# Patient Record
Sex: Female | Born: 1970 | Race: Black or African American | Hispanic: No | Marital: Single | Smoking: Never smoker
Health system: Southern US, Community
[De-identification: ages and names within clinical notes are randomized; demographics above are authoritative.]

## PROBLEM LIST (undated history)

## (undated) DIAGNOSIS — E119 Type 2 diabetes mellitus without complications: Secondary | ICD-10-CM

## (undated) DIAGNOSIS — R7303 Prediabetes: Secondary | ICD-10-CM

## (undated) HISTORY — DX: Type 2 diabetes mellitus without complications: E11.9

## (undated) HISTORY — PX: OTHER SURGICAL HISTORY: SHX169

## (undated) HISTORY — PX: ABDOMINAL HYSTERECTOMY: SHX81

---

## 2000-08-15 ENCOUNTER — Inpatient Hospital Stay (HOSPITAL_COMMUNITY): Admission: AD | Admit: 2000-08-15 | Discharge: 2000-08-15 | Payer: Self-pay | Admitting: Obstetrics and Gynecology

## 2000-11-03 ENCOUNTER — Inpatient Hospital Stay (HOSPITAL_COMMUNITY): Admission: AD | Admit: 2000-11-03 | Discharge: 2000-11-10 | Payer: Self-pay | Admitting: Obstetrics and Gynecology

## 2000-11-15 ENCOUNTER — Encounter: Payer: Self-pay | Admitting: Obstetrics and Gynecology

## 2000-11-15 ENCOUNTER — Inpatient Hospital Stay (HOSPITAL_COMMUNITY): Admission: AD | Admit: 2000-11-15 | Discharge: 2000-11-20 | Payer: Self-pay | Admitting: Obstetrics and Gynecology

## 2000-12-13 ENCOUNTER — Other Ambulatory Visit: Admission: RE | Admit: 2000-12-13 | Discharge: 2000-12-13 | Payer: Self-pay | Admitting: *Deleted

## 2011-11-24 DIAGNOSIS — K922 Gastrointestinal hemorrhage, unspecified: Secondary | ICD-10-CM | POA: Insufficient documentation

## 2012-02-02 DIAGNOSIS — S32509A Unspecified fracture of unspecified pubis, initial encounter for closed fracture: Secondary | ICD-10-CM | POA: Insufficient documentation

## 2015-01-02 DIAGNOSIS — R768 Other specified abnormal immunological findings in serum: Secondary | ICD-10-CM | POA: Insufficient documentation

## 2015-12-06 ENCOUNTER — Emergency Department
Admission: EM | Admit: 2015-12-06 | Discharge: 2015-12-06 | Disposition: A | Discharge status: Home / Self Care | Payer: BLUE CROSS/BLUE SHIELD | Attending: Emergency Medicine | Admitting: Emergency Medicine

## 2015-12-06 ENCOUNTER — Encounter: Payer: Self-pay | Admitting: Emergency Medicine

## 2015-12-06 DIAGNOSIS — L509 Urticaria, unspecified: Secondary | ICD-10-CM | POA: Insufficient documentation

## 2015-12-06 DIAGNOSIS — L299 Pruritus, unspecified: Secondary | ICD-10-CM | POA: Diagnosis present

## 2015-12-06 DIAGNOSIS — L508 Other urticaria: Secondary | ICD-10-CM

## 2015-12-06 HISTORY — DX: Prediabetes: R73.03

## 2015-12-06 MED ORDER — PREDNISONE 10 MG (21) PO TBPK: ORAL_TABLET | ORAL | Status: DC

## 2015-12-06 MED ORDER — HYDROXYZINE HCL 25 MG PO TABS: 25.0000 mg | ORAL_TABLET | Freq: Three times a day (TID) | ORAL | Status: DC | PRN

## 2015-12-06 NOTE — ED Notes (Signed)
States received insect bites while at beach yesterday.

## 2015-12-06 NOTE — ED Notes (Addendum)
Patient states that yesterday she notice bit marks on her left arm and left leg. Patient states that the marks are itching, not painful.

## 2015-12-06 NOTE — Discharge Instructions (Signed)
Hives Hives are itchy, red, swollen areas of the skin. They can vary in size and location on your body. Hives can come and go for hours or several days (acute hives) or for several weeks (chronic hives). Hives do not spread from person to person (noncontagious). They may get worse with scratching, exercise, and emotional stress. CAUSES   Allergic reaction to food, additives, or drugs.  Infections, including the common cold.  Illness, such as vasculitis, lupus, or thyroid disease.  Exposure to sunlight, heat, or cold.  Exercise.  Stress.  Contact with chemicals. SYMPTOMS   Red or white swollen patches on the skin. The patches may change size, shape, and location quickly and repeatedly.  Itching.  Swelling of the hands, feet, and face. This may occur if hives develop deeper in the skin. DIAGNOSIS  Your caregiver can usually tell what is wrong by performing a physical exam. Skin or blood tests may also be done to determine the cause of your hives. In some cases, the cause cannot be determined. TREATMENT  Mild cases usually get better with medicines such as antihistamines. Severe cases may require an emergency epinephrine injection. If the cause of your hives is known, treatment includes avoiding that trigger.  HOME CARE INSTRUCTIONS   Avoid causes that trigger your hives.  Take antihistamines as directed by your caregiver to reduce the severity of your hives. Non-sedating or low-sedating antihistamines are usually recommended. Do not drive while taking an antihistamine.  Take any other medicines prescribed for itching as directed by your caregiver.  Wear loose-fitting clothing.  Keep all follow-up appointments as directed by your caregiver. SEEK MEDICAL CARE IF:   You have persistent or severe itching that is not relieved with medicine.  You have painful or swollen joints. SEEK IMMEDIATE MEDICAL CARE IF:   You have a fever.  Your tongue or lips are swollen.  You have  trouble breathing or swallowing.  You feel tightness in the throat or chest.  You have abdominal pain. These problems may be the first sign of a life-threatening allergic reaction. Call your local emergency services (911 in [place] MAKE SURE YOU:   Understand these instructions.  Will watch your condition.  Will get help right away if you are not doing well or get worse.   This information is not intended to replace advice given to you by your health care provider. Make sure you discuss any questions you have with your health care provider.   Document Released: 05/24/2005 Document Revised: 05/29/2013 Document Reviewed: 08/17/2011 Elsevier Interactive Patient Education 2016 Elsevier Inc.  

## 2015-12-06 NOTE — ED Provider Notes (Signed)
[facility] Emergency Department Provider Note  ____________________________________________  Time seen: Approximately 2:29 PM  I have reviewed the triage vital signs and the nursing notes.   HISTORY  Chief Complaint Insect Bite   HPI Kara Perez is a 45 y.o. female presents to the emergency department for evaluation of large, red raised areas on her left arm and left upper leg. She states that she went to the beach last week, and unlabored back yesterday she noticed that she was itching and noticed the hives. The areas have increased in size today and the itching is severe.  Past Medical History  Diagnosis Date  . Prediabetes     There are no active problems to display for this patient.   Past Surgical History  Procedure Laterality Date  . Abdominal hysterectomy    . Lacerated liver      Current Outpatient Rx  Name  Route  Sig  Dispense  Refill  . hydrOXYzine (ATARAX/VISTARIL) 25 MG tablet   Oral   Take 1 tablet (25 mg total) by mouth 3 (three) times daily as needed.   30 tablet   0   . predniSONE (STERAPRED UNI-PAK 21 TAB) 10 MG (21) TBPK tablet      Take 6 tablets on day 1 Take 5 tablets on day 2 Take 4 tablets on day 3 Take 3 tablets on day 4 Take 2 tablets on day 5 Take 1 tablet on day 6   21 tablet   0     Allergies Morphine and related  No family history on file.  Social History Social History  Substance Use Topics  . Smoking status: Never Smoker   . Smokeless tobacco: None  . Alcohol Use: No    Review of Systems  Constitutional: Negative for fever/chills Respiratory: Negative for shortness of breath. Musculoskeletal: Negative for pain. Skin: Positive for itching and redness Neurological: Negative for headaches, focal weakness or numbness. ____________________________________________   PHYSICAL EXAM:  VITAL SIGNS: ED Triage Vitals  Enc Vitals Group     BP 12/06/15 1401 122/72 mmHg     Pulse Rate  12/06/15 1401 66     Resp 12/06/15 1401 18     Temp 12/06/15 1401 98.3 F (36.8 C)     Temp Source 12/06/15 1401 Oral     SpO2 12/06/15 1401 95 %     Weight 12/06/15 1401 247 lb (112.038 kg)     Height 12/06/15 1401 5\' 11"  (1.803 m)     Head Cir --      Peak Flow --      Pain Score 12/06/15 1403 5     Pain Loc --      Pain Edu? --      Excl. in [place]? --      Constitutional: Alert and oriented. Well appearing and in no acute distress. Eyes: Conjunctivae are normal. EOMI. Nose: No congestion/rhinnorhea. Mouth/Throat: Mucous membranes are moist.   Neck: No stridor. Cardiovascular: Good peripheral circulation. Respiratory: Normal respiratory effort.  No retractions. Lungs Clear. Musculoskeletal: FROM throughout. Neurologic:  Normal speech and language. No gross focal neurologic deficits are appreciated. Skin: Erythematous, warm, raised areas on the left upper extremity and the left upper thigh.  ____________________________________________   LABS (all labs ordered are listed, but only abnormal results are displayed)  Labs Reviewed - No data to display ____________________________________________  EKG   ____________________________________________  RADIOLOGY   ____________________________________________   PROCEDURES  Procedure(s) performed: None ____________________________________________   INITIAL IMPRESSION /  ASSESSMENT AND PLAN / ED COURSE  Pertinent labs & imaging results that were available during my care of the patient were reviewed by me and considered in my medical decision making (see chart for details).  She will be advised to take the prednisone and hydroxyzine as prescribed.  She was advised to follow up with her primary care provider for symptoms that are not improving over the next 2-3 days.  She was also advised to return to the emergency department for symptoms that change or worsen if unable to schedule an  appointment.  ____________________________________________   FINAL CLINICAL IMPRESSION(S) / ED DIAGNOSES  Final diagnoses:  Urticaria, acute    New Prescriptions   HYDROXYZINE (ATARAX/VISTARIL) 25 MG TABLET    Take 1 tablet (25 mg total) by mouth 3 (three) times daily as needed.   PREDNISONE (STERAPRED UNI-PAK 21 TAB) 10 MG (21) TBPK TABLET    Take 6 tablets on day 1 Take 5 tablets on day 2 Take 4 tablets on day 3 Take 3 tablets on day 4 Take 2 tablets on day 5 Take 1 tablet on day 6     Estefania Kamiya B Leisl Spurrier, FNP 12/06/15 1435  Delman Kitten, MD 12/06/15 1539

## 2016-07-07 ENCOUNTER — Encounter: Payer: Self-pay | Admitting: *Deleted

## 2016-07-07 ENCOUNTER — Emergency Department
Admission: EM | Admit: 2016-07-07 | Discharge: 2016-07-07 | Disposition: A | Discharge status: Home / Self Care | Payer: BLUE CROSS/BLUE SHIELD | Attending: Emergency Medicine | Admitting: Emergency Medicine

## 2016-07-07 DIAGNOSIS — J111 Influenza due to unidentified influenza virus with other respiratory manifestations: Secondary | ICD-10-CM

## 2016-07-07 DIAGNOSIS — Z79899 Other long term (current) drug therapy: Secondary | ICD-10-CM | POA: Insufficient documentation

## 2016-07-07 DIAGNOSIS — R05 Cough: Secondary | ICD-10-CM | POA: Diagnosis present

## 2016-07-07 LAB — INFLUENZA PANEL BY PCR (TYPE A & B)
Influenza A By PCR: POSITIVE — AB
Influenza B By PCR: NEGATIVE

## 2016-07-07 MED ORDER — NAPROXEN 500 MG PO TABS: 500.0000 mg | ORAL_TABLET | Freq: Two times a day (BID) | ORAL | 0 refills | Status: AC

## 2016-07-07 MED ORDER — IBUPROFEN 600 MG PO TABS
600.0000 mg | ORAL_TABLET | Freq: Once | ORAL | Status: AC
  Administered 2016-07-07: 600 mg via ORAL
  Filled 2016-07-07: qty 1

## 2016-07-07 MED ORDER — OSELTAMIVIR PHOSPHATE 75 MG PO CAPS: 75.0000 mg | ORAL_CAPSULE | Freq: Two times a day (BID) | ORAL | 0 refills | Status: AC

## 2016-07-07 NOTE — ED Provider Notes (Signed)
[facility] Emergency Department Provider Note  ____________________________________________  Time seen: Approximately 6:31 PM  I have reviewed the triage vital signs and the nursing notes.   HISTORY  Chief Complaint Cough; Nasal Congestion; and Generalized Body Aches    HPI Kerstin A Vergel is a 46 y.o. female the emergency department with non productive cough, congestion, body aches for 2 days. Patient is eating and drinking normally. Son has similar symptoms. Patient has taken over-the-counter flu medication, which have not helped. Patient did not receive flu shot this year.   Past Medical History:  Diagnosis Date  . Prediabetes     There are no active problems to display for this patient.   Past Surgical History:  Procedure Laterality Date  . ABDOMINAL HYSTERECTOMY    . lacerated liver      Prior to Admission medications   Medication Sig Start Date End Date Taking? Authorizing Provider  hydrOXYzine (ATARAX/VISTARIL) 25 MG tablet Take 1 tablet (25 mg total) by mouth 3 (three) times daily as needed. 12/06/15   Victorino Dike, FNP  naproxen (NAPROSYN) 500 MG tablet Take 1 tablet (500 mg total) by mouth 2 (two) times daily with a meal. 07/07/16 07/07/17  Laban Emperor, PA-C  oseltamivir (TAMIFLU) 75 MG capsule Take 1 capsule (75 mg total) by mouth 2 (two) times daily. 07/07/16 07/12/16  Laban Emperor, PA-C  predniSONE (STERAPRED UNI-PAK 21 TAB) 10 MG (21) TBPK tablet Take 6 tablets on day 1 Take 5 tablets on day 2 Take 4 tablets on day 3 Take 3 tablets on day 4 Take 2 tablets on day 5 Take 1 tablet on day 6 12/06/15   Victorino Dike, FNP    Allergies Morphine and related  History reviewed. No pertinent family history.  Social History Social History  Substance Use Topics  . Smoking status: Never Smoker  . Smokeless tobacco: Not on file  . Alcohol use No     Review of Systems  Eyes: No visual changes. No discharge. ENT: Positive for  congestion and rhinorrhea. Cardiovascular: No chest pain. Respiratory: Positive for cough. No SOB. Gastrointestinal: No abdominal pain.  No nausea, no vomiting.  No diarrhea.  No constipation. Skin: Negative for rash, abrasions, lacerations, ecchymosis. Neurological: Negative for headaches.   ____________________________________________   PHYSICAL EXAM:  VITAL SIGNS: ED Triage Vitals [07/07/16 1018]  Enc Vitals Group     BP 110/75     Pulse Rate (!) 108     Resp 18     Temp 99.8 F (37.7 C)     Temp Source Oral     SpO2 95 %     Weight 238 lb (108 kg)     Height 5\' 11"  (1.803 m)     Head Circumference      Peak Flow      Pain Score 8     Pain Loc      Pain Edu?      Excl. in [place]?      Constitutional: Alert and oriented. Well appearing and in no acute distress. Eyes: Conjunctivae are normal. PERRL. EOMI. No discharge. Head: Atraumatic. ENT: No frontal and maxillary sinus tenderness.      Ears: Tympanic membranes pearly gray with good landmarks. No discharge.      Nose: Mild congestion/rhinnorhea.      Mouth/Throat: Mucous membranes are moist. Oropharynx non-erythematous. Tonsils not enlarged. No exudates. Uvula midline. Neck: No stridor.   Hematological/Lymphatic/Immunilogical: No cervical lymphadenopathy. Cardiovascular: Normal rate, regular rhythm. Normal  S1 and S2.  Good peripheral circulation. Respiratory: Normal respiratory effort without tachypnea or retractions. Lungs CTAB. Good air entry to the bases with no decreased or absent breath sounds. Gastrointestinal: Bowel sounds 4 quadrants. Soft and nontender to palpation. No guarding or rigidity. No palpable masses. No distention. Musculoskeletal: Full range of motion to all extremities. No gross deformities appreciated. Neurologic:  Normal speech and language. No gross focal neurologic deficits are appreciated.  Skin:  Skin is warm, dry and intact. No rash noted. Psychiatric: Mood and affect are normal. Speech and  behavior are normal. Patient exhibits appropriate insight and judgement.   ____________________________________________   LABS (all labs ordered are listed, but only abnormal results are displayed)  Labs Reviewed  INFLUENZA PANEL BY PCR (TYPE A & B) - Abnormal; Notable for the following:       Result Value   Influenza A By PCR POSITIVE (*)    All other components within normal limits   ____________________________________________  EKG   ____________________________________________  RADIOLOGY   No results found.  ____________________________________________    PROCEDURES  Procedure(s) performed:    Procedures    Medications  ibuprofen (ADVIL,MOTRIN) tablet 600 mg (600 mg Oral Given 07/07/16 1046)     ____________________________________________   INITIAL IMPRESSION / ASSESSMENT AND PLAN / ED COURSE  Pertinent labs & imaging results that were available during my care of the patient were reviewed by me and considered in my medical decision making (see chart for details).  Review of the [place] CSRS was performed in accordance of the [place] prior to dispensing any controlled drugs.     Patient's diagnosis is consistent with Influenza A. Vital signs and exam are reassuring. Influenza test positive for influenza A. Patient was given ibuprofen in ED for fever and muscle aches. Patient will be discharged home with prescriptions for naproxen and Tamiflu. Patient is to follow up with PCP as needed or otherwise directed. Patient is given ED precautions to return to the ED for any worsening or new symptoms.     ____________________________________________  FINAL CLINICAL IMPRESSION(S) / ED DIAGNOSES  Final diagnoses:  Influenza      NEW MEDICATIONS STARTED DURING THIS VISIT:  Discharge Medication List as of 07/07/2016 12:17 PM    START taking these medications   Details  naproxen (NAPROSYN) 500 MG tablet Take 1 tablet (500 mg total) by mouth 2 (two) times daily  with a meal., Starting Wed 07/07/2016, Until Thu 07/07/2017, Print    oseltamivir (TAMIFLU) 75 MG capsule Take 1 capsule (75 mg total) by mouth 2 (two) times daily., Starting Wed 07/07/2016, Until Mon 07/12/2016, Print            This chart was dictated using voice recognition software/Dragon. Despite best efforts to proofread, errors can occur which can change the meaning. Any change was purely unintentional.    Laban Emperor, PA-C 07/07/16 [place], MD 07/08/16 [phone]

## 2016-07-07 NOTE — ED Notes (Signed)
Nurse first note   States she developed body aches and fever fo r2 days

## 2016-07-07 NOTE — ED Triage Notes (Signed)
States body aches, cough, congestion and flu like symptoms for 2 days

## 2018-01-31 ENCOUNTER — Ambulatory Visit: Payer: BLUE CROSS/BLUE SHIELD | Admitting: Nurse Practitioner

## 2018-01-31 ENCOUNTER — Encounter: Payer: Self-pay | Admitting: Nurse Practitioner

## 2018-01-31 VITALS — BP 138/79 | HR 54 | Resp 16 | Ht 71.0 in | Wt 256.8 lb

## 2018-01-31 DIAGNOSIS — M25562 Pain in left knee: Secondary | ICD-10-CM

## 2018-01-31 DIAGNOSIS — M150 Primary generalized (osteo)arthritis: Secondary | ICD-10-CM | POA: Diagnosis not present

## 2018-01-31 DIAGNOSIS — Z0001 Encounter for general adult medical examination with abnormal findings: Secondary | ICD-10-CM

## 2018-01-31 DIAGNOSIS — Z1239 Encounter for other screening for malignant neoplasm of breast: Secondary | ICD-10-CM

## 2018-01-31 DIAGNOSIS — R7301 Impaired fasting glucose: Secondary | ICD-10-CM | POA: Diagnosis not present

## 2018-01-31 DIAGNOSIS — Z1231 Encounter for screening mammogram for malignant neoplasm of breast: Secondary | ICD-10-CM

## 2018-01-31 DIAGNOSIS — M25551 Pain in right hip: Secondary | ICD-10-CM | POA: Diagnosis not present

## 2018-01-31 DIAGNOSIS — G8929 Other chronic pain: Secondary | ICD-10-CM

## 2018-01-31 DIAGNOSIS — E559 Vitamin D deficiency, unspecified: Secondary | ICD-10-CM

## 2018-01-31 MED ORDER — MELOXICAM 7.5 MG PO TABS: 7.5000 mg | ORAL_TABLET | Freq: Two times a day (BID) | ORAL | 0 refills | Status: DC

## 2018-01-31 NOTE — Progress Notes (Signed)
[facility] [place], [place] [address] Internal MEDICINE  Office Visit Note  Patient Name: Kara Perez  [id number]  [id number]  Date of Service: 02/08/2018   Complaints/HPI Pt is here for establishment of PCP. Chief Complaint  Patient presents with  . New Patient (Initial Visit)    weight management  . Pain    joint and back pain. pt currently works 12hr shifts a day   Patient has chronic pain in right hip and left knee. She is new to the area and has not set up with orthopedic provider. Left knee does have swelling and mild deformity present. Left hip also has deformity noted.  Has been on belviq in the past. Did lose 32 pounds and had no negative side effects . She is doe to have routine blood work.    Current Medication: Outpatient Encounter Medications as of 01/31/2018  Medication Sig  . hydrOXYzine (ATARAX/VISTARIL) 25 MG tablet Take 1 tablet (25 mg total) by mouth 3 (three) times daily as needed. (Patient not taking: Reported on 01/31/2018)  . meloxicam (MOBIC) 7.5 MG tablet Take 1 tablet (7.5 mg total) by mouth 2 (two) times daily.  . predniSONE (STERAPRED UNI-PAK 21 TAB) 10 MG (21) TBPK tablet Take 6 tablets on day 1 Take 5 tablets on day 2 Take 4 tablets on day 3 Take 3 tablets on day 4 Take 2 tablets on day 5 Take 1 tablet on day 6 (Patient not taking: Reported on 01/31/2018)   No facility-administered encounter medications on file as of 01/31/2018.     Surgical History: Past Surgical History:  Procedure Laterality Date  . ABDOMINAL HYSTERECTOMY    . lacerated liver      Medical History: Past Medical History:  Diagnosis Date  . Prediabetes     Family History: Family History  Problem Relation Age of Onset  . Breast cancer Mother   . Hypertension Father   . Liver disease Father     Social History   Socioeconomic History  . Marital status: Legally Separated    Spouse name: Not on file  . Number of children: Not on file   . Years of education: Not on file  . Highest education level: Not on file  Occupational History  . Not on file  Social Needs  . Financial resource strain: Not on file  . Food insecurity:    Worry: Not on file    Inability: Not on file  . Transportation needs:    Medical: Not on file    Non-medical: Not on file  Tobacco Use  . Smoking status: Never Smoker  . Smokeless tobacco: Never Used  Substance and Sexual Activity  . Alcohol use: No  . Drug use: Not on file  . Sexual activity: Not on file  Lifestyle  . Physical activity:    Days per week: Not on file    Minutes per session: Not on file  . Stress: Not on file  Relationships  . Social connections:    Talks on phone: Not on file    Gets together: Not on file    Attends religious service: Not on file    Active member of club or organization: Not on file    Attends meetings of clubs or organizations: Not on file    Relationship status: Not on file  . Intimate partner violence:    Fear of current or ex partner: Not on file    Emotionally abused: Not on file  Physically abused: Not on file    Forced sexual activity: Not on file  Other Topics Concern  . Not on file  Social History Narrative  . Not on file     Review of Systems  Constitutional: Positive for fatigue and unexpected weight change. Negative for chills.       Recent weight gain.  HENT: Negative for congestion, postnasal drip, rhinorrhea, sneezing and sore throat.   Eyes: Negative.  Negative for redness.  Respiratory: Negative for cough, chest tightness and shortness of breath.   Cardiovascular: Negative for chest pain and palpitations.  Gastrointestinal: Negative for abdominal pain, constipation, diarrhea, nausea and vomiting.  Endocrine: Negative for cold intolerance, heat intolerance, polydipsia, polyphagia and polyuria.  Genitourinary: Negative.  Negative for dysuria and frequency.  Musculoskeletal: Positive for arthralgias, back pain and myalgias.  Negative for joint swelling and neck pain.  Skin: Negative for rash.  Allergic/Immunologic: Negative for environmental allergies.  Neurological: Negative for dizziness, tremors, weakness, numbness and headaches.  Hematological: Does not bruise/bleed easily.  Psychiatric/Behavioral: Negative for behavioral problems (Depression), sleep disturbance and suicidal ideas. The patient is not nervous/anxious.     Today's Vitals   01/31/18 1141  BP: 138/79  Pulse: (!) 54  Resp: 16  SpO2: 98%  Weight: 256 lb 12.8 oz (116.5 kg)  Height: 5\' 11"  (1.803 m)    Physical Exam  Constitutional: She is oriented to person, place, and time. She appears well-developed and well-nourished. No distress.  HENT:  Head: Normocephalic and atraumatic.  Nose: Nose normal.  Mouth/Throat: Oropharynx is clear and moist. No oropharyngeal exudate.  Eyes: Pupils are equal, round, and reactive to light. Conjunctivae and EOM are normal.  Neck: Normal range of motion. Neck supple. No JVD present. No tracheal deviation present. No thyromegaly present.  Cardiovascular: Normal rate, regular rhythm and normal heart sounds. Exam reveals no gallop and no friction rub.  No murmur heard. Pulmonary/Chest: Effort normal and breath sounds normal. No respiratory distress. She has no wheezes. She has no rales. She exhibits no tenderness.  Abdominal: Soft. Bowel sounds are normal. There is no tenderness.  Musculoskeletal: Normal range of motion.  Right hip pain, worse when putting weight on her leg. Some swelling is present. ROM and strength are intact . Left knee tenderness with some swelling present. Crepitus can be felt with flexion and extension of her knee. ROM and strength are intact.   Lymphadenopathy:    She has no cervical adenopathy.  Neurological: She is alert and oriented to person, place, and time. No cranial nerve deficit.  Skin: Skin is warm and dry. She is not diaphoretic.  Psychiatric: She has a normal mood and  affect. Her behavior is normal. Judgment and thought content normal.  Nursing note and vitals reviewed.  Assessment/Plan: 1. Chronic pain of left knee Add meloxicam 7.5mg  which may be taken twice daily as needed for pain and inflammation. Refer to orthopedics for further evaluation.   2. Chronic right hip pain Add meloxicam 7.5mg  which may be taken twice daily as needed for pain and inflammation. Refer to orthopedics for further evaluation.  - Ambulatory referral to Orthopedic Surgery  3. Primary generalized (osteo)arthritis Add meloxicam 7.5mg  which may be taken twice daily as needed for pain and inflammation. Refer to orthopedics for further evaluation.  - meloxicam (MOBIC) 7.5 MG tablet; Take 1 tablet (7.5 mg total) by mouth 2 (two) times daily.  Dispense: 30 tablet; Refill: 0  4. Impaired fasting glucose - CBC with Differential/Platelet - Comprehensive  metabolic panel - TSH - T4, free  5. Vitamin D deficiency - Vitamin D 1,25 dihydroxy  6. Screening for breast cancer - MM DIGITAL SCREENING BILATERAL; Future    General Counseling: Keziah verbalizes understanding of the findings of todays visit and agrees with plan of treatment. I have discussed any further diagnostic evaluation that may be needed or ordered today. We also reviewed her medications today. she has been encouraged to call the office with any questions or concerns that should arise related to todays visit.    Counseling:  This patient was seen by Leretha Pol FNP Collaboration with Dr Lavera Guise as a part of collaborative care agreement  Orders Placed This Encounter  Procedures  . MM DIGITAL SCREENING BILATERAL  . CBC with Differential/Platelet  . Comprehensive metabolic panel  . TSH  . T4, free  . Lipid panel  . Vitamin D 1,25 dihydroxy  . Ambulatory referral to Orthopedic Surgery    Meds ordered this encounter  Medications  . meloxicam (MOBIC) 7.5 MG tablet    Sig: Take 1 tablet (7.5 mg total)  by mouth 2 (two) times daily.    Dispense:  30 tablet    Refill:  0    Order Specific Question:   Supervising Provider    Answer:   Lavera Guise [[id number]]    Time spent: 25 Minutes

## 2018-02-08 DIAGNOSIS — G8929 Other chronic pain: Secondary | ICD-10-CM | POA: Insufficient documentation

## 2018-02-08 DIAGNOSIS — M150 Primary generalized (osteo)arthritis: Secondary | ICD-10-CM | POA: Insufficient documentation

## 2018-02-08 DIAGNOSIS — R7301 Impaired fasting glucose: Secondary | ICD-10-CM | POA: Insufficient documentation

## 2018-02-08 DIAGNOSIS — E559 Vitamin D deficiency, unspecified: Secondary | ICD-10-CM | POA: Insufficient documentation

## 2018-02-08 DIAGNOSIS — Z0001 Encounter for general adult medical examination with abnormal findings: Secondary | ICD-10-CM | POA: Insufficient documentation

## 2018-02-08 DIAGNOSIS — M25562 Pain in left knee: Principal | ICD-10-CM

## 2018-02-08 DIAGNOSIS — M25551 Pain in right hip: Secondary | ICD-10-CM

## 2018-02-14 ENCOUNTER — Other Ambulatory Visit: Payer: Self-pay | Admitting: Nurse Practitioner

## 2018-02-15 LAB — COMPREHENSIVE METABOLIC PANEL
A/G RATIO: 1.5 (ref 1.2–2.2)
ALT: 12 IU/L (ref 0–32)
AST: 14 IU/L (ref 0–40)
Albumin: 4.3 g/dL (ref 3.5–5.5)
Alkaline Phosphatase: 100 IU/L (ref 39–117)
BUN/Creatinine Ratio: 21 (ref 9–23)
BUN: 15 mg/dL (ref 6–24)
Bilirubin Total: 0.5 mg/dL (ref 0.0–1.2)
CO2: 22 mmol/L (ref 20–29)
CREATININE: 0.71 mg/dL (ref 0.57–1.00)
Calcium: 9 mg/dL (ref 8.7–10.2)
Chloride: 105 mmol/L (ref 96–106)
GFR calc Af Amer: 116 mL/min/{1.73_m2} (ref >59)
GFR, EST NON AFRICAN AMERICAN: 100 mL/min/{1.73_m2} (ref >59)
Globulin, Total: 2.8 g/dL (ref 1.5–4.5)
Glucose: 106 mg/dL — ABNORMAL HIGH (ref 65–99)
POTASSIUM: 4.3 mmol/L (ref 3.5–5.2)
Sodium: 143 mmol/L (ref 134–144)
TOTAL PROTEIN: 7.1 g/dL (ref 6.0–8.5)

## 2018-02-15 LAB — CBC
HEMOGLOBIN: 13.7 g/dL (ref 11.1–15.9)
Hematocrit: 40.8 % (ref 34.0–46.6)
MCH: 30.6 pg (ref 26.6–33.0)
MCHC: 33.6 g/dL (ref 31.5–35.7)
MCV: 91 fL (ref 79–97)
Platelets: 214 10*3/uL (ref 150–450)
RBC: 4.48 x10E6/uL (ref 3.77–5.28)
RDW: 14.2 % (ref 12.3–15.4)
WBC: 3.2 10*3/uL — ABNORMAL LOW (ref 3.4–10.8)

## 2018-02-15 LAB — HGB A1C W/O EAG: Hgb A1c MFr Bld: 6.4 % — ABNORMAL HIGH (ref 4.8–5.6)

## 2018-02-15 LAB — TSH: TSH: 1.15 u[IU]/mL (ref 0.450–4.500)

## 2018-02-15 LAB — LIPID PANEL W/O CHOL/HDL RATIO
CHOLESTEROL TOTAL: 228 mg/dL — AB (ref 100–199)
HDL: 59 mg/dL (ref >39)
LDL CALC: 154 mg/dL — AB (ref 0–99)
Triglycerides: 76 mg/dL (ref 0–149)
VLDL Cholesterol Cal: 15 mg/dL (ref 5–40)

## 2018-02-15 LAB — T4, FREE: FREE T4: 1.21 ng/dL (ref 0.82–1.77)

## 2018-02-15 LAB — VITAMIN D 25 HYDROXY (VIT D DEFICIENCY, FRACTURES): Vit D, 25-Hydroxy: 30.8 ng/mL (ref 30.0–100.0)

## 2018-02-24 ENCOUNTER — Other Ambulatory Visit: Payer: Self-pay | Admitting: Nurse Practitioner

## 2018-03-03 ENCOUNTER — Other Ambulatory Visit: Payer: Self-pay

## 2018-03-03 DIAGNOSIS — M150 Primary generalized (osteo)arthritis: Secondary | ICD-10-CM

## 2018-03-03 MED ORDER — MELOXICAM 7.5 MG PO TABS: 7.5000 mg | ORAL_TABLET | Freq: Two times a day (BID) | ORAL | 2 refills | Status: DC

## 2018-03-07 ENCOUNTER — Ambulatory Visit
Admission: RE | Admit: 2018-03-07 | Discharge: 2018-03-07 | Disposition: A | Discharge status: Home / Self Care | Payer: BLUE CROSS/BLUE SHIELD | Source: Ambulatory Visit | Attending: Nurse Practitioner | Admitting: Nurse Practitioner

## 2018-03-07 DIAGNOSIS — Z1239 Encounter for other screening for malignant neoplasm of breast: Secondary | ICD-10-CM | POA: Diagnosis present

## 2018-03-08 ENCOUNTER — Ambulatory Visit (INDEPENDENT_AMBULATORY_CARE_PROVIDER_SITE_OTHER): Payer: BLUE CROSS/BLUE SHIELD | Admitting: Nurse Practitioner

## 2018-03-08 ENCOUNTER — Encounter: Payer: Self-pay | Admitting: Nurse Practitioner

## 2018-03-08 VITALS — BP 114/78 | HR 67 | Resp 16 | Ht 71.0 in | Wt 263.4 lb

## 2018-03-08 DIAGNOSIS — Z6836 Body mass index (BMI) 36.0-36.9, adult: Secondary | ICD-10-CM

## 2018-03-08 DIAGNOSIS — D729 Disorder of white blood cells, unspecified: Secondary | ICD-10-CM | POA: Diagnosis not present

## 2018-03-08 DIAGNOSIS — Z0001 Encounter for general adult medical examination with abnormal findings: Secondary | ICD-10-CM | POA: Diagnosis not present

## 2018-03-08 DIAGNOSIS — M150 Primary generalized (osteo)arthritis: Secondary | ICD-10-CM | POA: Diagnosis not present

## 2018-03-08 DIAGNOSIS — R300 Dysuria: Secondary | ICD-10-CM
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
## 2018-03-09 LAB — MICROSCOPIC EXAMINATION: CASTS: NONE SEEN /LPF

## 2018-03-09 LAB — UA/M W/RFLX CULTURE, ROUTINE
Bilirubin, UA: NEGATIVE
GLUCOSE, UA: NEGATIVE
Ketones, UA: NEGATIVE
Leukocytes, UA: NEGATIVE
Nitrite, UA: NEGATIVE
PH UA: 5.5 (ref 5.0–7.5)
RBC, UA: NEGATIVE
SPEC GRAV UA: 1.023 (ref 1.005–1.030)
Urobilinogen, Ur: 1 mg/dL (ref 0.2–1.0)

## 2018-03-13 ENCOUNTER — Inpatient Hospital Stay
Admission: RE | Admit: 2018-03-13 | Discharge: 2018-03-13 | Disposition: A | Discharge status: Home / Self Care | Payer: Self-pay | Source: Ambulatory Visit | Attending: Nurse Practitioner | Admitting: Nurse Practitioner

## 2018-03-13 ENCOUNTER — Other Ambulatory Visit: Payer: Self-pay | Admitting: Nurse Practitioner

## 2018-03-13 DIAGNOSIS — Z1239 Encounter for other screening for malignant neoplasm of breast: Secondary | ICD-10-CM

## 2018-03-14 DIAGNOSIS — Z6836 Body mass index (BMI) 36.0-36.9, adult: Secondary | ICD-10-CM | POA: Insufficient documentation

## 2018-03-14 DIAGNOSIS — R300 Dysuria: Secondary | ICD-10-CM | POA: Insufficient documentation

## 2018-03-14 DIAGNOSIS — D729 Disorder of white blood cells, unspecified: Secondary | ICD-10-CM | POA: Insufficient documentation

## 2018-03-21 ENCOUNTER — Telehealth: Payer: Self-pay

## 2018-03-21 NOTE — Telephone Encounter (Signed)
Yes. I filled out paperwork and gave it to front desk to be faxed directly from our office. I did this last week.

## 2018-03-23 NOTE — Telephone Encounter (Signed)
Try to  Call voicemail full

## 2018-04-05 ENCOUNTER — Other Ambulatory Visit: Payer: Self-pay | Admitting: Nurse Practitioner

## 2018-04-06 LAB — CBC
HEMATOCRIT: 38.6 % (ref 34.0–46.6)
HEMOGLOBIN: 12.7 g/dL (ref 11.1–15.9)
MCH: 29.4 pg (ref 26.6–33.0)
MCHC: 32.9 g/dL (ref 31.5–35.7)
MCV: 89 fL (ref 79–97)
Platelets: 216 10*3/uL (ref 150–450)
RBC: 4.32 x10E6/uL (ref 3.77–5.28)
RDW: 13 % (ref 12.3–15.4)
WBC: 3.5 10*3/uL (ref 3.4–10.8)

## 2018-04-12 ENCOUNTER — Telehealth: Payer: Self-pay

## 2018-04-12 NOTE — Telephone Encounter (Signed)
Informed pt of her lab results

## 2018-04-12 NOTE — Telephone Encounter (Signed)
-----   Message from Ronnell Freshwater, NP sent at 04/11/2018  6:37 PM EST ----- Hey. Please let the patient know that her lab results returned to normal. thanks

## 2018-06-12 ENCOUNTER — Ambulatory Visit: Payer: Self-pay | Admitting: Nurse Practitioner

## 2018-06-23 ENCOUNTER — Ambulatory Visit: Payer: Self-pay | Admitting: Nurse Practitioner

## 2018-07-25 ENCOUNTER — Ambulatory Visit: Payer: Self-pay | Admitting: Nurse Practitioner

## 2019-01-19 ENCOUNTER — Ambulatory Visit: Payer: BLUE CROSS/BLUE SHIELD | Admitting: Nurse Practitioner

## 2019-02-09 ENCOUNTER — Ambulatory Visit: Payer: BLUE CROSS/BLUE SHIELD | Admitting: Nurse Practitioner

## 2019-03-08 ENCOUNTER — Ambulatory Visit: Payer: BLUE CROSS/BLUE SHIELD | Admitting: Nurse Practitioner

## 2019-03-15 ENCOUNTER — Ambulatory Visit: Payer: Medicaid Other | Admitting: Nurse Practitioner

## 2019-03-15 ENCOUNTER — Other Ambulatory Visit: Payer: Self-pay

## 2019-03-15 ENCOUNTER — Encounter: Payer: Self-pay | Admitting: Nurse Practitioner

## 2019-03-15 VITALS — BP 129/88 | HR 58 | Temp 97.0°F | Resp 16 | Ht 71.0 in | Wt 267.0 lb

## 2019-03-15 DIAGNOSIS — Z1231 Encounter for screening mammogram for malignant neoplasm of breast: Secondary | ICD-10-CM

## 2019-03-15 DIAGNOSIS — R1012 Left upper quadrant pain: Secondary | ICD-10-CM

## 2019-03-15 DIAGNOSIS — Z6837 Body mass index (BMI) 37.0-37.9, adult: Secondary | ICD-10-CM | POA: Diagnosis not present

## 2019-03-15 DIAGNOSIS — R300 Dysuria: Secondary | ICD-10-CM

## 2019-03-15 DIAGNOSIS — Z1211 Encounter for screening for malignant neoplasm of colon: Secondary | ICD-10-CM

## 2019-03-15 DIAGNOSIS — Z0001 Encounter for general adult medical examination with abnormal findings: Secondary | ICD-10-CM

## 2019-03-15 DIAGNOSIS — M150 Primary generalized (osteo)arthritis: Secondary | ICD-10-CM

## 2019-03-15 MED ORDER — PHENTERMINE HCL 37.5 MG PO TABS: 37.5000 mg | ORAL_TABLET | Freq: Every day | ORAL | 1 refills | Status: DC

## 2019-03-15 MED ORDER — MELOXICAM 7.5 MG PO TABS: 7.5000 mg | ORAL_TABLET | Freq: Every day | ORAL | 2 refills | Status: DC
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
## 2019-03-16 LAB — UA/M W/RFLX CULTURE, ROUTINE
Bilirubin, UA: NEGATIVE
Glucose, UA: NEGATIVE
Ketones, UA: NEGATIVE
Leukocytes,UA: NEGATIVE
Nitrite, UA: NEGATIVE
RBC, UA: NEGATIVE
Specific Gravity, UA: 1.021 (ref 1.005–1.030)
Urobilinogen, Ur: 0.2 mg/dL (ref 0.2–1.0)
pH, UA: 5 (ref 5.0–7.5)

## 2019-03-16 LAB — MICROSCOPIC EXAMINATION
Casts: NONE SEEN /lpf
Epithelial Cells (non renal): >10 /hpf — AB (ref 0–10)

## 2019-03-20 ENCOUNTER — Ambulatory Visit: Payer: BLUE CROSS/BLUE SHIELD | Admitting: Nurse Practitioner

## 2019-03-21 ENCOUNTER — Telehealth: Payer: Self-pay

## 2019-03-21 ENCOUNTER — Other Ambulatory Visit: Payer: Self-pay

## 2019-03-21 DIAGNOSIS — Z86010 Personal history of colonic polyps: Secondary | ICD-10-CM

## 2019-03-21 DIAGNOSIS — Z1211 Encounter for screening for malignant neoplasm of colon: Secondary | ICD-10-CM

## 2019-03-21 NOTE — Telephone Encounter (Signed)
Gastroenterology Pre-Procedure Review  Request Date: Friday 05/11/19 Requesting Physician: Dr. Bonna Gains  PATIENT REVIEW QUESTIONS: The patient responded to the following health history questions as indicated:    1. Are you having any GI issues? no 2. Do you have a personal history of Polyps? yes (10-12 years ago) 3. Do you have a family history of Colon Cancer or Polyps? no 4. Diabetes Mellitus? no 5. Joint replacements in the past 12 months?no 6. Major health problems in the past 3 months?no 7. Any artificial heart valves, MVP, or defibrillator?no    MEDICATIONS & ALLERGIES:    Patient reports the following regarding taking any anticoagulation/antiplatelet therapy:   Plavix, Coumadin, Eliquis, Xarelto, Lovenox, Pradaxa, Brilinta, or Effient? no Aspirin? no  Patient confirms/reports the following medications:  Current Outpatient Medications  Medication Sig Dispense Refill  . meloxicam (MOBIC) 7.5 MG tablet Take 1 tablet (7.5 mg total) by mouth daily. 30 tablet 2  . phentermine (ADIPEX-P) 37.5 MG tablet Take 1 tablet (37.5 mg total) by mouth daily before breakfast. 30 tablet 1   No current facility-administered medications for this visit.     Patient confirms/reports the following allergies:  Allergies  Allergen Reactions  . Morphine And Related Itching    No orders of the defined types were placed in this encounter.   AUTHORIZATION INFORMATION Primary Insurance: 1D#: Group #:  Secondary Insurance: 1D#: Group #:  SCHEDULE INFORMATION: Date: 05/11/19 Time: Location:[facility]

## 2019-03-23 ENCOUNTER — Other Ambulatory Visit: Payer: Self-pay | Admitting: Nurse Practitioner

## 2019-03-25 DIAGNOSIS — R1012 Left upper quadrant pain: Secondary | ICD-10-CM | POA: Insufficient documentation

## 2019-03-25 DIAGNOSIS — Z1231 Encounter for screening mammogram for malignant neoplasm of breast: Secondary | ICD-10-CM | POA: Insufficient documentation

## 2019-03-25 DIAGNOSIS — Z6837 Body mass index (BMI) 37.0-37.9, adult: Secondary | ICD-10-CM | POA: Insufficient documentation

## 2019-03-25 DIAGNOSIS — Z1211 Encounter for screening for malignant neoplasm of colon: Secondary | ICD-10-CM | POA: Insufficient documentation

## 2019-03-26 ENCOUNTER — Telehealth: Payer: Self-pay

## 2019-03-26 ENCOUNTER — Telehealth: Payer: Self-pay | Admitting: Gastroenterology

## 2019-03-26 LAB — COMPREHENSIVE METABOLIC PANEL
ALT: 22 IU/L (ref 0–32)
AST: 27 IU/L (ref 0–40)
Albumin/Globulin Ratio: 1.4 (ref 1.2–2.2)
Albumin: 4.4 g/dL (ref 3.8–4.8)
Alkaline Phosphatase: 122 IU/L — ABNORMAL HIGH (ref 39–117)
BUN/Creatinine Ratio: 17 (ref 9–23)
BUN: 12 mg/dL (ref 6–24)
Bilirubin Total: 0.4 mg/dL (ref 0.0–1.2)
CO2: 19 mmol/L — ABNORMAL LOW (ref 20–29)
Calcium: 9.7 mg/dL (ref 8.7–10.2)
Chloride: 102 mmol/L (ref 96–106)
Creatinine, Ser: 0.7 mg/dL (ref 0.57–1.00)
GFR calc Af Amer: 117 mL/min/{1.73_m2} (ref >59)
GFR calc non Af Amer: 101 mL/min/{1.73_m2} (ref >59)
Globulin, Total: 3.1 g/dL (ref 1.5–4.5)
Glucose: 117 mg/dL — ABNORMAL HIGH (ref 65–99)
Potassium: 4.2 mmol/L (ref 3.5–5.2)
Sodium: 140 mmol/L (ref 134–144)
Total Protein: 7.5 g/dL (ref 6.0–8.5)

## 2019-03-26 LAB — CBC
Hematocrit: 42.3 % (ref 34.0–46.6)
Hemoglobin: 14.2 g/dL (ref 11.1–15.9)
MCH: 29.7 pg (ref 26.6–33.0)
MCHC: 33.6 g/dL (ref 31.5–35.7)
MCV: 89 fL (ref 79–97)
Platelets: 204 10*3/uL (ref 150–450)
RBC: 4.78 x10E6/uL (ref 3.77–5.28)
RDW: 13 % (ref 11.7–15.4)
WBC: 3.6 10*3/uL (ref 3.4–10.8)

## 2019-03-26 LAB — VITAMIN D 25 HYDROXY (VIT D DEFICIENCY, FRACTURES): Vit D, 25-Hydroxy: 28.7 ng/mL — ABNORMAL LOW (ref 30.0–100.0)

## 2019-03-26 LAB — T4, FREE: Free T4: 1.46 ng/dL (ref 0.82–1.77)

## 2019-03-26 LAB — LIPID PANEL W/O CHOL/HDL RATIO
Cholesterol, Total: 209 mg/dL — ABNORMAL HIGH (ref 100–199)
HDL: 53 mg/dL (ref >39)
LDL Chol Calc (NIH): 139 mg/dL — ABNORMAL HIGH (ref 0–99)
Triglycerides: 97 mg/dL (ref 0–149)
VLDL Cholesterol Cal: 17 mg/dL (ref 5–40)

## 2019-03-26 LAB — HGB A1C W/O EAG: Hgb A1c MFr Bld: 7 % — ABNORMAL HIGH (ref 4.8–5.6)

## 2019-03-26 LAB — TSH: TSH: 1.08 u[IU]/mL (ref 0.450–4.500)

## 2019-03-26 NOTE — Telephone Encounter (Signed)
Patients colonoscopy has been canceled for 05/11/19 per her request.  She will schedule in January. Trish in Endo has been notified.  Thanks [employer]

## 2019-03-26 NOTE — Telephone Encounter (Signed)
Due to her work schedule she needs to r/s to January 2021. Note:   her insurance will not change.  Thanks [employer]

## 2019-03-26 NOTE — Telephone Encounter (Signed)
Pt left vm to r/s her procedure for january

## 2019-03-26 NOTE — Telephone Encounter (Signed)
Returned patients call LVM for her to call office back if she would still like to cancel her 05/11/19 colonoscopy scheduled at [facility].  Thanks [employer]

## 2019-03-28 NOTE — Progress Notes (Signed)
Review with patient at visit 04/12/2019

## 2019-03-28 NOTE — Progress Notes (Signed)
New diabetes. May need  to start on metformin.

## 2019-04-06 ENCOUNTER — Other Ambulatory Visit: Payer: Self-pay

## 2019-04-06 ENCOUNTER — Ambulatory Visit (INDEPENDENT_AMBULATORY_CARE_PROVIDER_SITE_OTHER): Payer: Medicaid Other

## 2019-04-06 DIAGNOSIS — R1012 Left upper quadrant pain: Secondary | ICD-10-CM

## 2019-04-08 NOTE — Progress Notes (Signed)
Enlarged liver with hyperechoic mass. Will get CT scan of abdomen for further evaluation. Refer to Dr. Allen Norris.

## 2019-04-10 ENCOUNTER — Ambulatory Visit
Admission: RE | Admit: 2019-04-10 | Discharge: 2019-04-10 | Disposition: A | Discharge status: Home / Self Care | Payer: Medicaid Other | Source: Ambulatory Visit | Attending: Nurse Practitioner | Admitting: Nurse Practitioner

## 2019-04-10 ENCOUNTER — Other Ambulatory Visit: Payer: Self-pay

## 2019-04-10 DIAGNOSIS — Z1231 Encounter for screening mammogram for malignant neoplasm of breast: Secondary | ICD-10-CM | POA: Insufficient documentation

## 2019-04-11 NOTE — Progress Notes (Signed)
Additional images recommended.

## 2019-04-12 ENCOUNTER — Encounter: Payer: Self-pay | Admitting: Nurse Practitioner

## 2019-04-12 ENCOUNTER — Other Ambulatory Visit: Payer: Self-pay | Admitting: Nurse Practitioner

## 2019-04-12 ENCOUNTER — Other Ambulatory Visit: Payer: Self-pay

## 2019-04-12 ENCOUNTER — Ambulatory Visit: Payer: Medicaid Other | Admitting: Nurse Practitioner

## 2019-04-12 VITALS — BP 123/80 | HR 87 | Temp 97.6°F | Resp 16 | Ht 71.0 in | Wt 261.0 lb

## 2019-04-12 DIAGNOSIS — D376 Neoplasm of uncertain behavior of liver, gallbladder and bile ducts: Secondary | ICD-10-CM

## 2019-04-12 DIAGNOSIS — E119 Type 2 diabetes mellitus without complications: Secondary | ICD-10-CM | POA: Diagnosis not present

## 2019-04-12 DIAGNOSIS — Z6837 Body mass index (BMI) 37.0-37.9, adult: Secondary | ICD-10-CM

## 2019-04-12 DIAGNOSIS — N6489 Other specified disorders of breast: Secondary | ICD-10-CM

## 2019-04-12 DIAGNOSIS — R928 Other abnormal and inconclusive findings on diagnostic imaging of breast: Secondary | ICD-10-CM

## 2019-04-12 MED ORDER — PHENTERMINE HCL 37.5 MG PO TABS: 37.5000 mg | ORAL_TABLET | Freq: Every day | ORAL | 0 refills | Status: DC
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
## 2019-04-18 ENCOUNTER — Telehealth: Payer: Self-pay

## 2019-04-18 ENCOUNTER — Other Ambulatory Visit: Payer: Self-pay | Admitting: Nurse Practitioner

## 2019-04-18 DIAGNOSIS — D376 Neoplasm of uncertain behavior of liver, gallbladder and bile ducts: Secondary | ICD-10-CM

## 2019-04-18 DIAGNOSIS — K769 Liver disease, unspecified: Secondary | ICD-10-CM

## 2019-04-18 NOTE — Progress Notes (Signed)
Changed order for abdominal CT to CT abdomen with and without IV contrast. New order in epic.

## 2019-04-18 NOTE — Telephone Encounter (Signed)
Left message and asked pt to call back regarding ct appt and follow up appt results. Beth

## 2019-04-20 ENCOUNTER — Ambulatory Visit
Admission: RE | Admit: 2019-04-20 | Discharge: 2019-04-20 | Disposition: A | Discharge status: Home / Self Care | Payer: Medicaid Other | Source: Ambulatory Visit | Attending: Nurse Practitioner | Admitting: Nurse Practitioner

## 2019-04-20 DIAGNOSIS — R928 Other abnormal and inconclusive findings on diagnostic imaging of breast: Secondary | ICD-10-CM | POA: Insufficient documentation

## 2019-04-20 DIAGNOSIS — N6489 Other specified disorders of breast: Secondary | ICD-10-CM | POA: Diagnosis present

## 2019-04-23 ENCOUNTER — Other Ambulatory Visit: Payer: Self-pay

## 2019-04-23 ENCOUNTER — Encounter (INDEPENDENT_AMBULATORY_CARE_PROVIDER_SITE_OTHER): Payer: Self-pay

## 2019-04-23 ENCOUNTER — Ambulatory Visit
Admission: RE | Admit: 2019-04-23 | Discharge: 2019-04-23 | Disposition: A | Discharge status: Home / Self Care | Payer: Medicaid Other | Source: Ambulatory Visit | Attending: Nurse Practitioner | Admitting: Nurse Practitioner

## 2019-04-23 DIAGNOSIS — D376 Neoplasm of uncertain behavior of liver, gallbladder and bile ducts: Secondary | ICD-10-CM | POA: Diagnosis not present

## 2019-04-23 DIAGNOSIS — K769 Liver disease, unspecified: Secondary | ICD-10-CM | POA: Diagnosis present

## 2019-04-23 MED ORDER — IOHEXOL 300 MG/ML  SOLN
100.0000 mL | Freq: Once | INTRAMUSCULAR | Status: AC | PRN
  Administered 2019-04-23: 100 mL via INTRAVENOUS

## 2019-04-23 NOTE — Progress Notes (Signed)
The patient has appointment with me 11/119/2020 to discuss CT results. Will do STAT referral to surgery regarding large hemangioma of liver.

## 2019-04-24 ENCOUNTER — Telehealth: Payer: Self-pay

## 2019-04-24 ENCOUNTER — Other Ambulatory Visit: Payer: Self-pay | Admitting: Nurse Practitioner

## 2019-04-24 ENCOUNTER — Ambulatory Visit: Payer: Medicaid Other

## 2019-04-24 DIAGNOSIS — M150 Primary generalized (osteo)arthritis: Secondary | ICD-10-CM

## 2019-04-24 NOTE — Telephone Encounter (Signed)
Patient needed to reschedule appointment on 04/26/19 patient has to work, rescheduled appointment to 04/30/19. Kara Perez

## 2019-04-26 ENCOUNTER — Telehealth: Payer: Self-pay

## 2019-04-26 ENCOUNTER — Ambulatory Visit: Payer: Medicaid Other | Admitting: Adult Health

## 2019-04-26 NOTE — Telephone Encounter (Signed)
Confirmed appointment with patient. klh °

## 2019-04-30 ENCOUNTER — Ambulatory Visit: Payer: Medicaid Other | Admitting: Adult Health

## 2019-04-30 ENCOUNTER — Other Ambulatory Visit: Payer: Self-pay

## 2019-04-30 ENCOUNTER — Encounter (INDEPENDENT_AMBULATORY_CARE_PROVIDER_SITE_OTHER): Payer: Self-pay

## 2019-04-30 ENCOUNTER — Encounter: Payer: Self-pay | Admitting: Adult Health

## 2019-04-30 VITALS — BP 133/90 | HR 63 | Temp 97.2°F | Resp 16 | Ht 71.0 in | Wt 261.0 lb

## 2019-04-30 DIAGNOSIS — D1800 Hemangioma unspecified site: Secondary | ICD-10-CM | POA: Diagnosis not present

## 2019-04-30 DIAGNOSIS — E119 Type 2 diabetes mellitus without complications: Secondary | ICD-10-CM

## 2019-04-30 DIAGNOSIS — Z6837 Body mass index (BMI) 37.0-37.9, adult: Secondary | ICD-10-CM
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
## 2019-05-11 ENCOUNTER — Ambulatory Visit: Admit: 2019-05-11 | Payer: Medicaid Other | Admitting: Gastroenterology

## 2019-05-11 SURGERY — COLONOSCOPY WITH PROPOFOL
Anesthesia: General

## 2019-05-17 ENCOUNTER — Encounter: Payer: Self-pay | Admitting: Nurse Practitioner

## 2019-05-17 ENCOUNTER — Other Ambulatory Visit: Payer: Self-pay

## 2019-05-17 ENCOUNTER — Ambulatory Visit: Payer: Medicaid Other | Admitting: Nurse Practitioner

## 2019-05-17 VITALS — Ht 71.0 in | Wt 257.2 lb

## 2019-05-17 DIAGNOSIS — Z6837 Body mass index (BMI) 37.0-37.9, adult: Secondary | ICD-10-CM

## 2019-05-17 DIAGNOSIS — R5383 Other fatigue: Secondary | ICD-10-CM

## 2019-05-17 MED ORDER — PHENTERMINE HCL 37.5 MG PO TABS: 37.5000 mg | ORAL_TABLET | Freq: Every day | ORAL | 1 refills | Status: DC

## 2019-05-17 NOTE — Progress Notes (Signed)
[facility] [place], [place] [address]  Internal MEDICINE  Telephone Visit  Patient Name: Kara Perez  [id number]  [id number]  Date of Service: 05/20/2019  I connected with the patient at 5:08pm by webcam and verified the patients identity using two identifiers.   I discussed the limitations, risks, security and privacy concerns of performing an evaluation and management service by webcam and the availability of in person appointments. I also discussed with the patient that there may be a patient responsible charge related to the service.  The patient expressed understanding and agrees to proceed.    Chief Complaint  Patient presents with  . Telephone Assessment  . Telephone Screen  . Follow-up    weight management     The patient has been contacted via webcam for follow up visit due to concerns for spread of novel coronavirus. Today, she is following up for weight management. She states that has lost about five pounds since she was last seen. She is taking phentermine to help with weight management. She is tolerating this well without negative side effects.       Current Medication: Outpatient Encounter Medications as of 05/17/2019  Medication Sig  . meloxicam (MOBIC) 7.5 MG tablet Take 1 tablet (7.5 mg total) by mouth daily.  . phentermine (ADIPEX-P) 37.5 MG tablet Take 1 tablet (37.5 mg total) by mouth daily before breakfast.  . [DISCONTINUED] phentermine (ADIPEX-P) 37.5 MG tablet Take 1 tablet (37.5 mg total) by mouth daily before breakfast.   No facility-administered encounter medications on file as of 05/17/2019.    Surgical History: Past Surgical History:  Procedure Laterality Date  . ABDOMINAL HYSTERECTOMY    . lacerated liver      Medical History: Past Medical History:  Diagnosis Date  . Prediabetes     Family History: Family History  Problem Relation Age of Onset  . Hypertension Father   . Liver disease Father   . Breast  cancer Paternal Grandmother 71    Social History   Socioeconomic History  . Marital status: Legally Separated    Spouse name: Not on file  . Number of children: Not on file  . Years of education: Not on file  . Highest education level: Not on file  Occupational History  . Not on file  Tobacco Use  . Smoking status: Never Smoker  . Smokeless tobacco: Never Used  Substance and Sexual Activity  . Alcohol use: Yes    Comment: ocassional  . Drug use: Never  . Sexual activity: Not on file  Other Topics Concern  . Not on file  Social History Narrative  . Not on file   Social Determinants of Health   Financial Resource Strain:   . Difficulty of Paying Living Expenses: Not on file  Food Insecurity:   . Worried About Charity fundraiser in the Last Year: Not on file  . Ran Out of Food in the Last Year: Not on file  Transportation Needs:   . Lack of Transportation (Medical): Not on file  . Lack of Transportation (Non-Medical): Not on file  Physical Activity:   . Days of Exercise per Week: Not on file  . Minutes of Exercise per Session: Not on file  Stress:   . Feeling of Stress : Not on file  Social Connections:   . Frequency of Communication with Friends and Family: Not on file  . Frequency of Social Gatherings with Friends and Family: Not on file  .  Attends Religious Services: Not on file  . Active Member of Clubs or Organizations: Not on file  . Attends Archivist Meetings: Not on file  . Marital Status: Not on file  Intimate Partner Violence:   . Fear of Current or Ex-Partner: Not on file  . Emotionally Abused: Not on file  . Physically Abused: Not on file  . Sexually Abused: Not on file      Review of Systems  Constitutional: Positive for fatigue. Negative for activity change, chills and unexpected weight change.       Five pound weight loss since most recent visit.   HENT: Negative for congestion, postnasal drip, rhinorrhea, sneezing and sore throat.    Respiratory: Negative for cough, chest tightness, shortness of breath and wheezing.   Cardiovascular: Negative for chest pain and palpitations.  Gastrointestinal: Negative for abdominal pain, constipation, diarrhea, nausea and vomiting.  Endocrine: Negative for cold intolerance, heat intolerance, polydipsia and polyuria.  Musculoskeletal: Positive for arthralgias, back pain and myalgias. Negative for joint swelling and neck pain.  Skin: Negative for rash.  Allergic/Immunologic: Negative for environmental allergies.  Neurological: Negative for dizziness, tremors, weakness, numbness and headaches.  Hematological: Negative for adenopathy. Does not bruise/bleed easily.  Psychiatric/Behavioral: Negative for behavioral problems (Depression), sleep disturbance and suicidal ideas. The patient is not nervous/anxious.     Today's Vitals   05/17/19 1610  Weight: 257 lb 3.2 oz (116.7 kg)  Height: 5\' 11"  (1.803 m)   Body mass index is 35.87 kg/m.  Observation/Objective:   The patient is alert and oriented. She is pleasant and answers all questions appropriately. Breathing is non-labored. She is in no acute distress at this time.    Assessment/Plan: 1. Other fatigue Possibly stress related. Will continue to monitor.   2. Adult body mass index 37.0-37.9 Improving. May continue phentermine daily. Limit calorie intake to 1200-1500 calories per day and incorporate exercise into daily routine.  - phentermine (ADIPEX-P) 37.5 MG tablet; Take 1 tablet (37.5 mg total) by mouth daily before breakfast.  Dispense: 30 tablet; Refill: 1  General Counseling: Kara Perez verbalizes understanding of the findings of today's phone visit and agrees with plan of treatment. I have discussed any further diagnostic evaluation that may be needed or ordered today. We also reviewed her medications today. she has been encouraged to call the office with any questions or concerns that should arise related to todays  visit.     There is a liability release in patients' chart. There has been a 10 minute discussion about the side effects including but not limited to elevated blood pressure, anxiety, lack of sleep and dry mouth. Pt understands and will like to start/continue on appetite suppressant at this time. There will be one month RX given at the time of visit with proper follow up. Nova diet plan with restricted calories is given to the pt. Pt understands and agrees with  plan of treatment  This patient was seen by Leretha Pol FNP Collaboration with Dr Lavera Guise as a part of collaborative care agreement  Meds ordered this encounter  Medications  . phentermine (ADIPEX-P) 37.5 MG tablet    Sig: Take 1 tablet (37.5 mg total) by mouth daily before breakfast.    Dispense:  30 tablet    Refill:  1    Order Specific Question:   Supervising Provider    Answer:   Lavera Guise [id number]    Time spent: 38 Minutes    Dr Lavera Guise Internal  medicine

## 2019-05-20 DIAGNOSIS — R5383 Other fatigue: Secondary | ICD-10-CM | POA: Insufficient documentation

## 2019-05-22 ENCOUNTER — Telehealth: Payer: Self-pay

## 2019-05-22 NOTE — Telephone Encounter (Signed)
Thanks

## 2019-05-22 NOTE — Telephone Encounter (Signed)
Called lmom informing patient referral was made, patient scheduled at [facility] on 07/22/2018 @1 :30pm with Dr. Charm Barges @ [phone] [address] [place] [address] location [phone]

## 2019-05-22 NOTE — Telephone Encounter (Signed)
-----   Message from Fenton sent at 05/22/2019  9:04 AM EST ----- Per [facility] can you call pt and or [facility] and see if they have this schd yet please ----- Message ----- From: Lavera Guise, MD Sent: 05/19/2019  10:43 AM EST To: Laurie Panda  When is her app ( [facility]// vascular)

## 2019-05-28 ENCOUNTER — Ambulatory Visit: Payer: Medicaid Other | Admitting: Nurse Practitioner

## 2019-06-12 ENCOUNTER — Ambulatory Visit: Payer: Medicaid Other | Admitting: Nurse Practitioner

## 2019-06-26 ENCOUNTER — Ambulatory Visit: Payer: Medicaid Other | Admitting: Nurse Practitioner

## 2019-06-28 ENCOUNTER — Telehealth: Payer: Self-pay

## 2019-06-28 NOTE — Telephone Encounter (Signed)
CONFIRMED 07-02-19 OV AS VIRTUAL °

## 2019-07-02 ENCOUNTER — Other Ambulatory Visit: Payer: Self-pay

## 2019-07-02 ENCOUNTER — Encounter: Payer: Self-pay | Admitting: Nurse Practitioner

## 2019-07-02 ENCOUNTER — Ambulatory Visit: Payer: Medicaid Other | Admitting: Nurse Practitioner

## 2019-07-02 VITALS — Ht 71.0 in | Wt 257.0 lb

## 2019-07-02 DIAGNOSIS — D1800 Hemangioma unspecified site: Secondary | ICD-10-CM

## 2019-07-02 DIAGNOSIS — Z6837 Body mass index (BMI) 37.0-37.9, adult: Secondary | ICD-10-CM

## 2019-07-02 MED ORDER — PHENTERMINE HCL 37.5 MG PO TABS: 37.5000 mg | ORAL_TABLET | Freq: Every day | ORAL | 1 refills | Status: DC

## 2019-07-02 NOTE — Progress Notes (Signed)
[facility] [place], [place] [address]  Internal MEDICINE  Telephone Visit  Patient Name: Kara Perez  [id number]  [id number]  Date of Service: 07/02/2019  I connected with the patient at 1:55pm by webcam and verified the patients identity using two identifiers.   I discussed the limitations, risks, security and privacy concerns of performing an evaluation and management service by webcam and the availability of in person appointments. I also discussed with the patient that there may be a patient responsible charge related to the service.  The patient expressed understanding and agrees to proceed.    Chief Complaint  Patient presents with  . Telephone Assessment  . Telephone Screen  . Follow-up  . Medication Refill    PHENTERMINE    The patient has been contacted via webcam for follow up visit due to concerns for spread of novel coronavirus. The patient presents for follow up visit. She is currently taking phentermine to help suppress her appetite. She has maintained her weight over past several weeks. She reports no negative side effects from taking this medication. She denies elevation in blood pressure. Denies chest pain or palpitations. Denies severe dry mouth or constipation. She is scheduled to see liver specialist in 07/2019.       Current Medication: Outpatient Encounter Medications as of 07/02/2019  Medication Sig  . meloxicam (MOBIC) 7.5 MG tablet Take 1 tablet (7.5 mg total) by mouth daily.  . phentermine (ADIPEX-P) 37.5 MG tablet Take 1 tablet (37.5 mg total) by mouth daily before breakfast.  . [DISCONTINUED] phentermine (ADIPEX-P) 37.5 MG tablet Take 1 tablet (37.5 mg total) by mouth daily before breakfast.   No facility-administered encounter medications on file as of 07/02/2019.    Surgical History: Past Surgical History:  Procedure Laterality Date  . ABDOMINAL HYSTERECTOMY    . lacerated liver      Medical History: Past Medical  History:  Diagnosis Date  . Prediabetes     Family History: Family History  Problem Relation Age of Onset  . Hypertension Father   . Liver disease Father   . Breast cancer Paternal Grandmother 66    Social History   Socioeconomic History  . Marital status: Legally Separated    Spouse name: Not on file  . Number of children: Not on file  . Years of education: Not on file  . Highest education level: Not on file  Occupational History  . Not on file  Tobacco Use  . Smoking status: Never Smoker  . Smokeless tobacco: Never Used  Substance and Sexual Activity  . Alcohol use: Yes    Comment: ocassional  . Drug use: Never  . Sexual activity: Not on file  Other Topics Concern  . Not on file  Social History Narrative  . Not on file   Social Determinants of Health   Financial Resource Strain:   . Difficulty of Paying Living Expenses: Not on file  Food Insecurity:   . Worried About Charity fundraiser in the Last Year: Not on file  . Ran Out of Food in the Last Year: Not on file  Transportation Needs:   . Lack of Transportation (Medical): Not on file  . Lack of Transportation (Non-Medical): Not on file  Physical Activity:   . Days of Exercise per Week: Not on file  . Minutes of Exercise per Session: Not on file  Stress:   . Feeling of Stress : Not on file  Social Connections:   .  Frequency of Communication with Friends and Family: Not on file  . Frequency of Social Gatherings with Friends and Family: Not on file  . Attends Religious Services: Not on file  . Active Member of Clubs or Organizations: Not on file  . Attends Archivist Meetings: Not on file  . Marital Status: Not on file  Intimate Partner Violence:   . Fear of Current or Ex-Partner: Not on file  . Emotionally Abused: Not on file  . Physically Abused: Not on file  . Sexually Abused: Not on file      Review of Systems  Constitutional: Negative for activity change, chills, fatigue and  unexpected weight change.  HENT: Negative for congestion, postnasal drip, rhinorrhea, sneezing and sore throat.   Respiratory: Negative for cough, chest tightness, shortness of breath and wheezing.   Cardiovascular: Negative for chest pain and palpitations.  Gastrointestinal: Negative for abdominal pain, constipation, diarrhea, nausea and vomiting.  Musculoskeletal: Negative for arthralgias, back pain, joint swelling and neck pain.  Skin: Negative for rash.  Allergic/Immunologic: Negative for environmental allergies.  Neurological: Negative for dizziness, tremors, numbness and headaches.  Hematological: Negative for adenopathy. Does not bruise/bleed easily.  Psychiatric/Behavioral: Negative for behavioral problems (Depression), sleep disturbance and suicidal ideas. The patient is not nervous/anxious.     Today's Vitals   07/02/19 1343  Weight: 257 lb (116.6 kg)  Height: 5\' 11"  (1.803 m)   Body mass index is 35.84 kg/m.  Observation/Objective:    The patient is alert and oriented. She is pleasant and answers all questions appropriately. Breathing is non-labored. She is in no acute distress at this time.    Assessment/Plan: 1. Hemangioma, unspecified site Patient scheduled for evaluation with liver specialist in February.   2. Adult body mass index 37.0-37.9 Weight maintained over past two months. May continue phentermine daily. Limit calorie intake to 1500 calories per day and incorporate exercise into daily routine.  - phentermine (ADIPEX-P) 37.5 MG tablet; Take 1 tablet (37.5 mg total) by mouth daily before breakfast.  Dispense: 30 tablet; Refill: 1  General Counseling: Kara Perez verbalizes understanding of the findings of today's phone visit and agrees with plan of treatment. I have discussed any further diagnostic evaluation that may be needed or ordered today. We also reviewed her medications today. she has been encouraged to call the office with any questions or concerns that  should arise related to todays visit.   There is a liability release in patients' chart. There has been a 10 minute discussion about the side effects including but not limited to elevated blood pressure, anxiety, lack of sleep and dry mouth. Pt understands and will like to start/continue on appetite suppressant at this time. There will be one month RX given at the time of visit with proper follow up. Kara Perez diet plan with restricted calories is given to the pt. Pt understands and agrees with  plan of treatment  This patient was seen by Leretha Pol FNP Collaboration with Dr Lavera Guise as a part of collaborative care agreement  Meds ordered this encounter  Medications  . phentermine (ADIPEX-P) 37.5 MG tablet    Sig: Take 1 tablet (37.5 mg total) by mouth daily before breakfast.    Dispense:  30 tablet    Refill:  1    Order Specific Question:   Supervising Provider    Answer:   Lavera Guise [id number]    Time spent: 50 Minutes    Dr Lavera Guise Internal medicine

## 2019-07-04 ENCOUNTER — Other Ambulatory Visit: Payer: Self-pay

## 2019-07-04 DIAGNOSIS — M150 Primary generalized (osteo)arthritis: Secondary | ICD-10-CM

## 2019-07-04 MED ORDER — MELOXICAM 7.5 MG PO TABS: 7.5000 mg | ORAL_TABLET | Freq: Every day | ORAL | 2 refills | Status: DC

## 2019-08-09 ENCOUNTER — Telehealth: Payer: Self-pay

## 2019-08-09 NOTE — Telephone Encounter (Signed)
Confirmed appointment on 08/13/2019 and screened for covid. klh °

## 2019-08-13 ENCOUNTER — Other Ambulatory Visit: Payer: Self-pay

## 2019-08-13 ENCOUNTER — Encounter: Payer: Self-pay | Admitting: Nurse Practitioner

## 2019-08-13 ENCOUNTER — Ambulatory Visit: Payer: Medicaid Other | Admitting: Nurse Practitioner

## 2019-08-13 VITALS — BP 132/70 | HR 58 | Temp 97.5°F | Resp 16 | Ht 71.0 in | Wt 250.0 lb

## 2019-08-13 DIAGNOSIS — M150 Primary generalized (osteo)arthritis: Secondary | ICD-10-CM

## 2019-08-13 DIAGNOSIS — R5383 Other fatigue: Secondary | ICD-10-CM

## 2019-08-13 DIAGNOSIS — Z6837 Body mass index (BMI) 37.0-37.9, adult: Secondary | ICD-10-CM | POA: Diagnosis not present

## 2019-08-13 MED ORDER — MELOXICAM 7.5 MG PO TABS: 7.5000 mg | ORAL_TABLET | Freq: Every day | ORAL | 3 refills | Status: DC

## 2019-08-13 MED ORDER — PHENTERMINE HCL 37.5 MG PO TABS: 37.5000 mg | ORAL_TABLET | Freq: Every day | ORAL | 1 refills | Status: DC

## 2019-08-13 NOTE — Progress Notes (Signed)
[facility] [place], [place] [address]  Internal MEDICINE  Office Visit Note  Patient Name: Kara Perez  [id number]  [id number]  Date of Service: 08/13/2019  Chief Complaint  Patient presents with  . Follow-up    weight management, has not went to the doc for liver due to transportation issues     The patient is here for routine visit. She has had a great deal of personal stress in her life. Had to move quickly to another town, lost her job, and has had car issues with both of the vehicles she owns. She states that her appetite is OK. She is currently taking phentermine to help with appetite control. She has lost seven pounds since her last visit. States that she feels like this success might be related to stress in some part.       Current Medication: Outpatient Encounter Medications as of 08/13/2019  Medication Sig  . meloxicam (MOBIC) 7.5 MG tablet Take 1 tablet (7.5 mg total) by mouth daily.  . phentermine (ADIPEX-P) 37.5 MG tablet Take 1 tablet (37.5 mg total) by mouth daily before breakfast.  . [DISCONTINUED] meloxicam (MOBIC) 7.5 MG tablet Take 1 tablet (7.5 mg total) by mouth daily.  . [DISCONTINUED] phentermine (ADIPEX-P) 37.5 MG tablet Take 1 tablet (37.5 mg total) by mouth daily before breakfast.   No facility-administered encounter medications on file as of 08/13/2019.    Surgical History: Past Surgical History:  Procedure Laterality Date  . ABDOMINAL HYSTERECTOMY    . lacerated liver      Medical History: Past Medical History:  Diagnosis Date  . Prediabetes     Family History: Family History  Problem Relation Age of Onset  . Hypertension Father   . Liver disease Father   . Breast cancer Paternal Grandmother 48    Social History   Socioeconomic History  . Marital status: Legally Separated    Spouse name: Not on file  . Number of children: Not on file  . Years of education: Not on file  . Highest education level: Not on  file  Occupational History  . Not on file  Tobacco Use  . Smoking status: Never Smoker  . Smokeless tobacco: Never Used  Substance and Sexual Activity  . Alcohol use: Yes    Comment: ocassional  . Drug use: Never  . Sexual activity: Not on file  Other Topics Concern  . Not on file  Social History Narrative  . Not on file   Social Determinants of Health   Financial Resource Strain:   . Difficulty of Paying Living Expenses: Not on file  Food Insecurity:   . Worried About Charity fundraiser in the Last Year: Not on file  . Ran Out of Food in the Last Year: Not on file  Transportation Needs:   . Lack of Transportation (Medical): Not on file  . Lack of Transportation (Non-Medical): Not on file  Physical Activity:   . Days of Exercise per Week: Not on file  . Minutes of Exercise per Session: Not on file  Stress:   . Feeling of Stress : Not on file  Social Connections:   . Frequency of Communication with Friends and Family: Not on file  . Frequency of Social Gatherings with Friends and Family: Not on file  . Attends Religious Services: Not on file  . Active Member of Clubs or Organizations: Not on file  . Attends Archivist Meetings: Not on file  .  Marital Status: Not on file  Intimate Partner Violence:   . Fear of Current or Ex-Partner: Not on file  . Emotionally Abused: Not on file  . Physically Abused: Not on file  . Sexually Abused: Not on file      Review of Systems  Constitutional: Negative for chills, fatigue and unexpected weight change.       Seven pound weight loss since her last visit.   HENT: Negative for congestion, postnasal drip, rhinorrhea, sneezing and sore throat.   Respiratory: Negative for cough, chest tightness, shortness of breath and wheezing.   Cardiovascular: Negative for chest pain and palpitations.  Gastrointestinal: Negative for abdominal pain, constipation, diarrhea, nausea and vomiting.       Mildly increased abdominal pain since  her move last Saturday.   Endocrine: Negative for cold intolerance, heat intolerance, polydipsia and polyuria.  Musculoskeletal: Negative for arthralgias, back pain, joint swelling and neck pain.  Skin: Negative for rash.  Allergic/Immunologic: Negative for environmental allergies.  Neurological: Negative for dizziness, tremors, numbness and headaches.  Hematological: Negative for adenopathy. Does not bruise/bleed easily.  Psychiatric/Behavioral: Negative for behavioral problems (Depression), sleep disturbance and suicidal ideas. The patient is nervous/anxious.        Increased personal stress.     Today's Vitals   08/13/19 0950  BP: 132/70  Pulse: (!) 58  Resp: 16  Temp: (!) 97.5 F (36.4 C)  SpO2: 98%  Weight: 250 lb (113.4 kg)  Height: 5\' 11"  (1.803 m)   Body mass index is 34.87 kg/m.  Physical Exam Vitals and nursing note reviewed.  Constitutional:      General: She is not in acute distress.    Appearance: Normal appearance. She is well-developed. She is obese. She is not diaphoretic.  HENT:     Head: Normocephalic and atraumatic.     Nose: Nose normal.     Mouth/Throat:     Pharynx: No oropharyngeal exudate.  Eyes:     Pupils: Pupils are equal, round, and reactive to light.  Neck:     Thyroid: No thyromegaly.     Vascular: No JVD.     Trachea: No tracheal deviation.  Cardiovascular:     Rate and Rhythm: Normal rate and regular rhythm.     Heart sounds: Normal heart sounds. No murmur. No friction rub. No gallop.   Pulmonary:     Effort: Pulmonary effort is normal. No respiratory distress.     Breath sounds: Normal breath sounds. No wheezing or rales.  Chest:     Chest wall: No tenderness.  Abdominal:     Palpations: Abdomen is soft.     Tenderness: There is abdominal tenderness.     Comments: Mild,generalized abdominal tenderness.   Musculoskeletal:        General: Normal range of motion.     Cervical back: Normal range of motion and neck supple.   Lymphadenopathy:     Cervical: No cervical adenopathy.  Skin:    General: Skin is warm and dry.  Neurological:     Mental Status: She is alert and oriented to person, place, and time.     Cranial Nerves: No cranial nerve deficit.  Psychiatric:        Attention and Perception: Attention and perception normal.        Mood and Affect: Affect normal. Mood is anxious and depressed.        Speech: Speech normal.        Behavior: Behavior normal. Behavior is cooperative.  Thought Content: Thought content normal.        Cognition and Memory: Cognition and memory normal.        Judgment: Judgment normal.     Comments: Patient is tearful throughout the visit.    Assessment/Plan: 1. Other fatigue Currently increased doe to stress. Will monitor.   2. Primary generalized (osteo)arthritis May continue to take meloxicam 7.5mg  daily when needed for inflammation and pain - meloxicam (MOBIC) 7.5 MG tablet; Take 1 tablet (7.5 mg total) by mouth daily.  Dispense: 30 tablet; Refill: 3  3. Adult body mass index 37.0-37.9 Improving. May continue phentermine 37.5mg  daily. Limit calorie intake to 1500 calories per day and incoprorate exercise into daily routine.  - phentermine (ADIPEX-P) 37.5 MG tablet; Take 1 tablet (37.5 mg total) by mouth daily before breakfast.  Dispense: 30 tablet; Refill: 1  General Counseling: Kara Perez verbalizes understanding of the findings of todays visit and agrees with plan of treatment. I have discussed any further diagnostic evaluation that may be needed or ordered today. We also reviewed her medications today. she has been encouraged to call the office with any questions or concerns that should arise related to todays visit.    There is a liability release in patients' chart. There has been a 10 minute discussion about the side effects including but not limited to elevated blood pressure, anxiety, lack of sleep and dry mouth. Pt understands and will like to  start/continue on appetite suppressant at this time. There will be one month RX given at the time of visit with proper follow up. Nova diet plan with restricted calories is given to the pt. Pt understands and agrees with  plan of treatment  This patient was seen by Leretha Pol FNP Collaboration with Dr Lavera Guise as a part of collaborative care agreement  Meds ordered this encounter  Medications  . meloxicam (MOBIC) 7.5 MG tablet    Sig: Take 1 tablet (7.5 mg total) by mouth daily.    Dispense:  30 tablet    Refill:  3    Order Specific Question:   Supervising Provider    Answer:   Lavera Guise [id number]  . phentermine (ADIPEX-P) 37.5 MG tablet    Sig: Take 1 tablet (37.5 mg total) by mouth daily before breakfast.    Dispense:  30 tablet    Refill:  1    Order Specific Question:   Supervising Provider    Answer:   Lavera Guise [id number]    Total time spent: 20 Minutes   Time spent includes review of chart, medications, test results, and follow up plan with the patient.      Dr Lavera Guise Internal medicine

## 2019-09-20 ENCOUNTER — Telehealth: Payer: Self-pay

## 2019-09-20 NOTE — Telephone Encounter (Signed)
CONFIRMED AND SCREENED FOR 09-24-19 OV. 

## 2019-09-24 ENCOUNTER — Ambulatory Visit: Payer: Medicaid Other | Admitting: Nurse Practitioner

## 2019-09-24 ENCOUNTER — Encounter: Payer: Self-pay | Admitting: Nurse Practitioner

## 2019-09-24 ENCOUNTER — Other Ambulatory Visit: Payer: Self-pay | Admitting: Nurse Practitioner

## 2019-09-24 ENCOUNTER — Other Ambulatory Visit: Payer: Self-pay

## 2019-09-24 VITALS — BP 132/80 | HR 58 | Temp 97.5°F | Resp 16 | Ht 71.0 in | Wt 248.3 lb

## 2019-09-24 DIAGNOSIS — H5369 Other night blindness: Secondary | ICD-10-CM

## 2019-09-24 DIAGNOSIS — H539 Unspecified visual disturbance: Secondary | ICD-10-CM

## 2019-09-24 DIAGNOSIS — R7303 Prediabetes: Secondary | ICD-10-CM

## 2019-09-24 DIAGNOSIS — Z6837 Body mass index (BMI) 37.0-37.9, adult: Secondary | ICD-10-CM

## 2019-09-24 DIAGNOSIS — Z113 Encounter for screening for infections with a predominantly sexual mode of transmission: Secondary | ICD-10-CM

## 2019-09-24 LAB — POCT GLYCOSYLATED HEMOGLOBIN (HGB A1C): Hemoglobin A1C: 6.4 % — AB (ref 4.0–5.6)

## 2019-09-24 MED ORDER — PHENTERMINE HCL 37.5 MG PO TABS: 37.5000 mg | ORAL_TABLET | Freq: Every day | ORAL | 1 refills | Status: DC

## 2019-09-24 NOTE — Progress Notes (Signed)
[facility], [place] [address]  Internal MEDICINE  Office Visit Note  Patient Name: Kara Perez  [id number]  [id number]  Date of Service: 10/03/2019  Chief Complaint  Patient presents with  . Follow-up    weight management   . Eye Problem    may need eye exam , left yee is blurry, does not do much night driving, seeing far distance   . SEXUALLY TRANSMITTED DISEASE    std check     The patient is here for follow up visit. She is currently taking phentermine to help with weight management. Has lost 2 pounds since her last visit. She has lost 11 pounds since the beginning of the year. She has no negative side effects associated with taking this medication. She has been noticing some changes with her vision, especially in the left eye. Vision is blurry and night driving is getting difficult. She states that she does have frequent eye watering and nasal congestion. she does have some seasonal allergies. She does not have eye doctor at this time. She does have strong family history of diabetes. Her last HgbA1c was 7.0. she does manage blood sugar through diet, but does not check her blood sugars. Today, this has improved to 6.4. no medication started to help control blood sugars.  She would like to be screening for sexually transmitted infections. She does not believe she has been exposed, she just likes to be checked periodically to feel safe.       Current Medication: Outpatient Encounter Medications as of 09/24/2019  Medication Sig  . meloxicam (MOBIC) 7.5 MG tablet Take 1 tablet (7.5 mg total) by mouth daily.  . phentermine (ADIPEX-P) 37.5 MG tablet Take 1 tablet (37.5 mg total) by mouth daily before breakfast.  . [DISCONTINUED] phentermine (ADIPEX-P) 37.5 MG tablet Take 1 tablet (37.5 mg total) by mouth daily before breakfast.   No facility-administered encounter medications on file as of 09/24/2019.    Surgical History: Past Surgical History:   Procedure Laterality Date  . ABDOMINAL HYSTERECTOMY    . lacerated liver      Medical History: Past Medical History:  Diagnosis Date  . Prediabetes     Family History: Family History  Problem Relation Age of Onset  . Hypertension Father   . Liver disease Father   . Breast cancer Paternal Grandmother 43    Social History   Socioeconomic History  . Marital status: Legally Separated    Spouse name: Not on file  . Number of children: Not on file  . Years of education: Not on file  . Highest education level: Not on file  Occupational History  . Not on file  Tobacco Use  . Smoking status: Never Smoker  . Smokeless tobacco: Never Used  Substance and Sexual Activity  . Alcohol use: Yes    Comment: ocassional  . Drug use: Never  . Sexual activity: Not on file  Other Topics Concern  . Not on file  Social History Narrative  . Not on file   Social Determinants of Health   Financial Resource Strain:   . Difficulty of Paying Living Expenses:   Food Insecurity:   . Worried About Charity fundraiser in the Last Year:   . Arboriculturist in the Last Year:   Transportation Needs:   . Film/video editor (Medical):   Marland Kitchen Lack of Transportation (Non-Medical):   Physical Activity:   . Days of Exercise per Week:   .  Minutes of Exercise per Session:   Stress:   . Feeling of Stress :   Social Connections:   . Frequency of Communication with Friends and Family:   . Frequency of Social Gatherings with Friends and Family:   . Attends Religious Services:   . Active Member of Clubs or Organizations:   . Attends Archivist Meetings:   Marland Kitchen Marital Status:   Intimate Partner Violence:   . Fear of Current or Ex-Partner:   . Emotionally Abused:   Marland Kitchen Physically Abused:   . Sexually Abused:       Review of Systems  Constitutional: Negative for chills, fatigue and unexpected weight change.       Two pound weight loss since her last visit .  HENT: Negative for  congestion, postnasal drip, rhinorrhea, sneezing and sore throat.   Eyes: Positive for visual disturbance.       Difficulty with night vision.   Respiratory: Negative for cough, chest tightness, shortness of breath and wheezing.   Cardiovascular: Negative for chest pain and palpitations.  Gastrointestinal: Negative for abdominal pain, constipation, diarrhea, nausea and vomiting.  Endocrine: Negative for cold intolerance, heat intolerance, polydipsia and polyuria.  Musculoskeletal: Negative for arthralgias, back pain, joint swelling and neck pain.  Skin: Negative for rash.  Allergic/Immunologic: Positive for environmental allergies.  Neurological: Negative for dizziness, tremors, numbness and headaches.  Hematological: Negative for adenopathy. Does not bruise/bleed easily.  Psychiatric/Behavioral: Negative for behavioral problems (Depression), sleep disturbance and suicidal ideas. The patient is nervous/anxious.        Increased personal stress.     Today's Vitals   09/24/19 0913  BP: 132/80  Pulse: (!) 58  Resp: 16  Temp: (!) 97.5 F (36.4 C)  SpO2: 97%  Weight: 248 lb 4.8 oz (112.6 kg)  Height: 5\' 11"  (1.803 m)   Body mass index is 34.63 kg/m.  Physical Exam Vitals and nursing note reviewed.  Constitutional:      General: She is not in acute distress.    Appearance: Normal appearance. She is well-developed. She is obese. She is not diaphoretic.  HENT:     Head: Normocephalic and atraumatic.     Nose: Nose normal.     Mouth/Throat:     Pharynx: No oropharyngeal exudate.  Eyes:     Pupils: Pupils are equal, round, and reactive to light.  Neck:     Thyroid: No thyromegaly.     Vascular: No JVD.     Trachea: No tracheal deviation.  Cardiovascular:     Rate and Rhythm: Normal rate and regular rhythm.     Heart sounds: Normal heart sounds. No murmur. No friction rub. No gallop.   Pulmonary:     Effort: Pulmonary effort is normal. No respiratory distress.     Breath  sounds: Normal breath sounds. No wheezing or rales.  Chest:     Chest wall: No tenderness.  Abdominal:     Palpations: Abdomen is soft.     Comments: Mild,generalized abdominal tenderness.   Musculoskeletal:        General: Normal range of motion.     Cervical back: Normal range of motion and neck supple.  Lymphadenopathy:     Cervical: No cervical adenopathy.  Skin:    General: Skin is warm and dry.  Neurological:     Mental Status: She is alert and oriented to person, place, and time.     Cranial Nerves: No cranial nerve deficit.  Psychiatric:  Attention and Perception: Attention and perception normal.        Mood and Affect: Affect normal. Mood is anxious and depressed.        Speech: Speech normal.        Behavior: Behavior normal. Behavior is cooperative.        Thought Content: Thought content normal.        Cognition and Memory: Cognition and memory normal.        Judgment: Judgment normal.     Comments: Patient is tearful throughout the visit.    Assessment/Plan:  1. Prediabetes - POCT HgB A1C 6.4 today. Continue to control with diet and exercise. Monitor closely.   2. Visual changes Refer to opthalmology for further evaluation and treatment  - Ambulatory referral to Ophthalmology  3. Diminished night vision Refer to opthalmology for further evaluation and treatment  - Ambulatory referral to Ophthalmology  4. Adult body mass index 37.0-37.9 May continue to take phentermine daily. Limit calorie intake to 1500 calories per day. Increase routine exercise.  - phentermine (ADIPEX-P) 37.5 MG tablet; Take 1 tablet (37.5 mg total) by mouth daily before breakfast.  Dispense: 30 tablet; Refill: 1  5. Screen for STD (sexually transmitted disease) Urine sample sent to screen for gonorrhea, chlamydia, and trichomoniasis  - Chlamydia/Gonococcus/Trichomonas, NAA  General Counseling: Laurencia verbalizes understanding of the findings of todays visit and agrees with plan of  treatment. I have discussed any further diagnostic evaluation that may be needed or ordered today. We also reviewed her medications today. she has been encouraged to call the office with any questions or concerns that should arise related to todays visit.   This patient was seen by Leretha Pol FNP Collaboration with Dr Lavera Guise as a part of collaborative care agreement  Orders Placed This Encounter  Procedures  . Chlamydia/Gonococcus/Trichomonas, NAA  . Ambulatory referral to Ophthalmology  . POCT HgB A1C    Meds ordered this encounter  Medications  . phentermine (ADIPEX-P) 37.5 MG tablet    Sig: Take 1 tablet (37.5 mg total) by mouth daily before breakfast.    Dispense:  30 tablet    Refill:  1    Order Specific Question:   Supervising Provider    Answer:   Lavera Guise [id number]    Total time spent: 30 Minutes   Time spent includes review of chart, medications, test results, and follow up plan with the patient.      Dr Lavera Guise Internal medicine

## 2019-09-25 LAB — HIV ANTIBODY (ROUTINE TESTING W REFLEX): HIV Screen 4th Generation wRfx: NONREACTIVE

## 2019-09-25 LAB — RPR QUALITATIVE: RPR Ser Ql: NONREACTIVE

## 2019-09-26 NOTE — Progress Notes (Signed)
Negative blood screening for hiv and RPR

## 2019-09-26 NOTE — Progress Notes (Signed)
Waiting for all results

## 2019-10-02 ENCOUNTER — Telehealth: Payer: Self-pay

## 2019-10-02 LAB — CHLAMYDIA/GONOCOCCUS/TRICHOMONAS, NAA
Chlamydia by NAA: NEGATIVE
Gonococcus by NAA: NEGATIVE
Trich vag by NAA: NEGATIVE

## 2019-10-02 NOTE — Telephone Encounter (Signed)
PT NOTIFIED FOR LABS AND MAILED LABS

## 2019-10-02 NOTE — Progress Notes (Signed)
Please let her know that screening for sexually transmitted infection was negative. Thanks.

## 2019-10-03 DIAGNOSIS — Z113 Encounter for screening for infections with a predominantly sexual mode of transmission: Secondary | ICD-10-CM | POA: Insufficient documentation

## 2019-10-03 DIAGNOSIS — R7303 Prediabetes: Secondary | ICD-10-CM | POA: Insufficient documentation

## 2019-10-03 DIAGNOSIS — H539 Unspecified visual disturbance: Secondary | ICD-10-CM | POA: Insufficient documentation

## 2019-10-03 DIAGNOSIS — H5369 Other night blindness: Secondary | ICD-10-CM | POA: Insufficient documentation

## 2019-10-30 ENCOUNTER — Telehealth: Payer: Self-pay

## 2019-10-30 NOTE — Telephone Encounter (Signed)
Confirmed and screened for 11-01-19 ov. °

## 2019-10-30 NOTE — Telephone Encounter (Signed)
Lmom to confirm and screen for 11-01-19 ov.

## 2019-11-01 ENCOUNTER — Ambulatory Visit: Payer: Medicaid Other | Admitting: Nurse Practitioner

## 2019-11-01 ENCOUNTER — Encounter: Payer: Self-pay | Admitting: Nurse Practitioner

## 2019-11-01 ENCOUNTER — Other Ambulatory Visit: Payer: Self-pay

## 2019-11-01 VITALS — BP 140/80 | HR 67 | Temp 97.4°F | Resp 16 | Ht 71.0 in | Wt 245.8 lb

## 2019-11-01 DIAGNOSIS — R5383 Other fatigue: Secondary | ICD-10-CM

## 2019-11-01 DIAGNOSIS — Z6837 Body mass index (BMI) 37.0-37.9, adult: Secondary | ICD-10-CM | POA: Diagnosis not present

## 2019-11-01 DIAGNOSIS — R7303 Prediabetes: Secondary | ICD-10-CM | POA: Diagnosis not present

## 2019-11-01 MED ORDER — PHENTERMINE HCL 37.5 MG PO TABS: 37.5000 mg | ORAL_TABLET | Freq: Every day | ORAL | 1 refills | Status: DC

## 2019-11-01 NOTE — Progress Notes (Signed)
[facility] [place], [place] [address]  Internal MEDICINE  Office Visit Note  Patient Name: Kara Perez  [id number]  [id number]  Date of Service: 11/01/2019  Chief Complaint  Patient presents with  . Follow-up    weight management     The patient is here for routine follow up. She is currently taking phentermine to help with weight management. She has lost three pounds since her last visit. She has lost 22 pounds since October, 2020. She reports no negative side effects associated with taking this medication. Blood pressure well managed.       Current Medication: Outpatient Encounter Medications as of 11/01/2019  Medication Sig  . meloxicam (MOBIC) 7.5 MG tablet Take 1 tablet (7.5 mg total) by mouth daily.  . phentermine (ADIPEX-P) 37.5 MG tablet Take 1 tablet (37.5 mg total) by mouth daily before breakfast.  . [DISCONTINUED] phentermine (ADIPEX-P) 37.5 MG tablet Take 1 tablet (37.5 mg total) by mouth daily before breakfast.   No facility-administered encounter medications on file as of 11/01/2019.    Surgical History: Past Surgical History:  Procedure Laterality Date  . ABDOMINAL HYSTERECTOMY    . lacerated liver      Medical History: Past Medical History:  Diagnosis Date  . Prediabetes     Family History: Family History  Problem Relation Age of Onset  . Hypertension Father   . Liver disease Father   . Breast cancer Paternal Grandmother 63    Social History   Socioeconomic History  . Marital status: Legally Separated    Spouse name: Not on file  . Number of children: Not on file  . Years of education: Not on file  . Highest education level: Not on file  Occupational History  . Not on file  Tobacco Use  . Smoking status: Never Smoker  . Smokeless tobacco: Never Used  Substance and Sexual Activity  . Alcohol use: Yes    Comment: ocassional  . Drug use: Never  . Sexual activity: Not on file  Other Topics Concern  . Not  on file  Social History Narrative  . Not on file   Social Determinants of Health   Financial Resource Strain:   . Difficulty of Paying Living Expenses:   Food Insecurity:   . Worried About Charity fundraiser in the Last Year:   . Arboriculturist in the Last Year:   Transportation Needs:   . Film/video editor (Medical):   Marland Kitchen Lack of Transportation (Non-Medical):   Physical Activity:   . Days of Exercise per Week:   . Minutes of Exercise per Session:   Stress:   . Feeling of Stress :   Social Connections:   . Frequency of Communication with Friends and Family:   . Frequency of Social Gatherings with Friends and Family:   . Attends Religious Services:   . Active Member of Clubs or Organizations:   . Attends Archivist Meetings:   Marland Kitchen Marital Status:   Intimate Partner Violence:   . Fear of Current or Ex-Partner:   . Emotionally Abused:   Marland Kitchen Physically Abused:   . Sexually Abused:       Review of Systems  Constitutional: Negative for chills, fatigue and unexpected weight change.       Three pound weight loss since her last visit. Has lost 22 pounds since her physical in 03/2019.  HENT: Negative for congestion, postnasal drip, rhinorrhea, sneezing and sore throat.  Eyes: Negative for visual disturbance.  Respiratory: Negative for cough, chest tightness, shortness of breath and wheezing.   Cardiovascular: Negative for chest pain and palpitations.  Gastrointestinal: Negative for abdominal pain, constipation, diarrhea, nausea and vomiting.  Endocrine: Negative for cold intolerance, heat intolerance, polydipsia and polyuria.  Musculoskeletal: Negative for arthralgias, back pain, joint swelling and neck pain.  Skin: Negative for rash.  Allergic/Immunologic: Positive for environmental allergies.  Neurological: Negative for dizziness, tremors, numbness and headaches.  Hematological: Negative for adenopathy. Does not bruise/bleed easily.  Psychiatric/Behavioral:  Negative for behavioral problems (Depression), sleep disturbance and suicidal ideas. The patient is nervous/anxious.        Increased personal stress.     Today's Vitals   11/01/19 0932  BP: 140/80  Pulse: 67  Resp: 16  Temp: (!) 97.4 F (36.3 C)  SpO2: 98%  Weight: 245 lb 12.8 oz (111.5 kg)  Height: 5\' 11"  (1.803 m)   Body mass index is 34.28 kg/m.  Physical Exam Vitals and nursing note reviewed.  Constitutional:      General: She is not in acute distress.    Appearance: Normal appearance. She is well-developed. She is obese. She is not diaphoretic.  HENT:     Head: Normocephalic and atraumatic.     Nose: Nose normal.     Mouth/Throat:     Pharynx: No oropharyngeal exudate.  Eyes:     Pupils: Pupils are equal, round, and reactive to light.  Neck:     Thyroid: No thyromegaly.     Vascular: No JVD.     Trachea: No tracheal deviation.  Cardiovascular:     Rate and Rhythm: Normal rate and regular rhythm.     Heart sounds: Normal heart sounds. No murmur. No friction rub. No gallop.   Pulmonary:     Effort: Pulmonary effort is normal. No respiratory distress.     Breath sounds: Normal breath sounds. No wheezing or rales.  Chest:     Chest wall: No tenderness.  Abdominal:     Palpations: Abdomen is soft.  Musculoskeletal:        General: Normal range of motion.     Cervical back: Normal range of motion and neck supple.  Lymphadenopathy:     Cervical: No cervical adenopathy.  Skin:    General: Skin is warm and dry.  Neurological:     Mental Status: She is alert and oriented to person, place, and time.     Cranial Nerves: No cranial nerve deficit.  Psychiatric:        Attention and Perception: Attention and perception normal.        Mood and Affect: Affect normal. Mood is anxious and depressed.        Speech: Speech normal.        Behavior: Behavior normal. Behavior is cooperative.        Thought Content: Thought content normal.        Cognition and Memory:  Cognition and memory normal.        Judgment: Judgment normal.     Comments: Patient is tearful throughout the visit.     Assessment/Plan: 1. Prediabetes Doing well. Will have labs checked prior to her next visit along with HgbA1c  2. Adult body mass index 37.0-37.9 Continues to improve. May continue with phentermine daily. Limit calorie intake to 1500 calories per day and incorporate exercise into daily routine.  - phentermine (ADIPEX-P) 37.5 MG tablet; Take 1 tablet (37.5 mg total) by mouth daily before breakfast.  Dispense: 30 tablet; Refill: 1  3. Other fatigue Check labs for further evaluation. Lab order slip given to patient today.    General Counseling: Maryam verbalizes understanding of the findings of todays visit and agrees with plan of treatment. I have discussed any further diagnostic evaluation that may be needed or ordered today. We also reviewed her medications today. she has been encouraged to call the office with any questions or concerns that should arise related to todays visit.   There is a liability release in patients' chart. There has been a 10 minute discussion about the side effects including but not limited to elevated blood pressure, anxiety, lack of sleep and dry mouth. Pt understands and will like to start/continue on appetite suppressant at this time. There will be one month RX given at the time of visit with proper follow up. Nova diet plan with restricted calories is given to the pt. Pt understands and agrees with  plan of treatment  This patient was seen by Leretha Pol FNP Collaboration with Dr Lavera Guise as a part of collaborative care agreement  Meds ordered this encounter  Medications  . phentermine (ADIPEX-P) 37.5 MG tablet    Sig: Take 1 tablet (37.5 mg total) by mouth daily before breakfast.    Dispense:  30 tablet    Refill:  1    Order Specific Question:   Supervising Provider    Answer:   Lavera Guise [id number]    Total time spent: 25  Minutes   Time spent includes review of chart, medications, test results, and follow up plan with the patient.      Dr Lavera Guise Internal medicine

## 2019-11-28 IMAGING — CT CT ABDOMEN WO/W CM
3 of 7 series · 14 of 32 positions shown, 19 images · IV contrast (omnipaque)
Comparison: None.

CLINICAL DATA: Worsening right upper quadrant pain. Unintentional
11 lb weight loss over past 6 weeks. Liver mass.

EXAM:
CT ABDOMEN WITHOUT AND WITH CONTRAST
TECHNIQUE: Multidetector CT imaging of the abdomen was performed following the
standard protocol before and following the bolus administration of
intravenous contrast.
CONTRAST:  100mL OMNIPAQUE IOHEXOL 300 MG/ML  SOLN

[Series 2: axial pre · axial · non-contrast · 0.66mm/px · z∈[-583,-518]mm · 2 of 54 slices shown]
[im 14/54  soft-tissue]
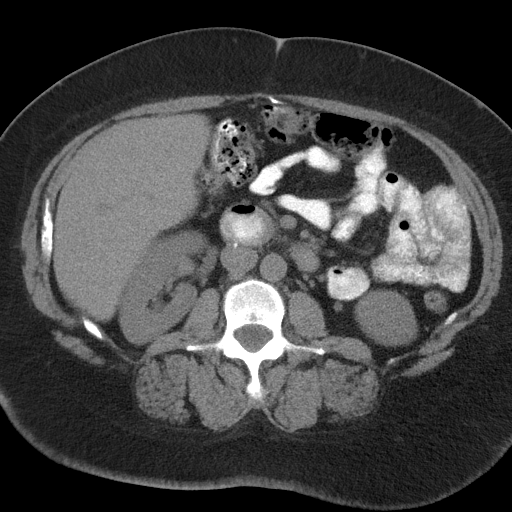
[im 27/54  soft-tissue]
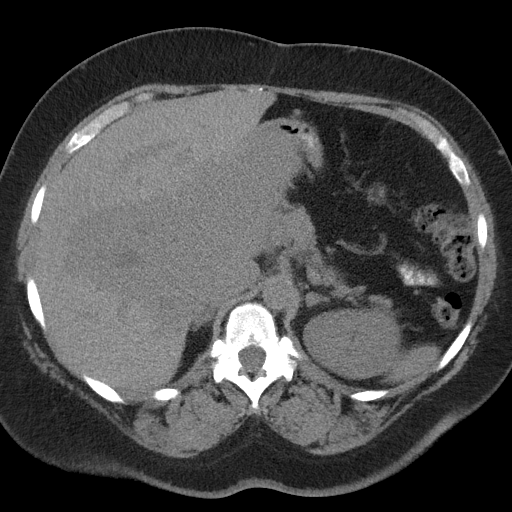

[Series 4: axial arterial · axial · arterial · 0.66mm/px · z∈[-626,-425]mm · 6 of 95 slices shown]
[im 14/95  soft-tissue]
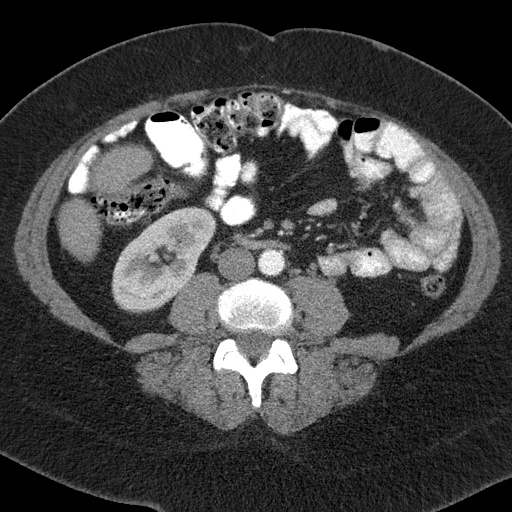
[im 27/95  soft-tissue]
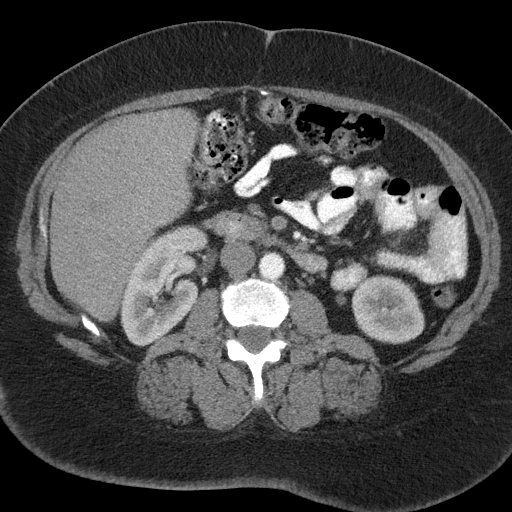
[im 41/95  soft-tissue]
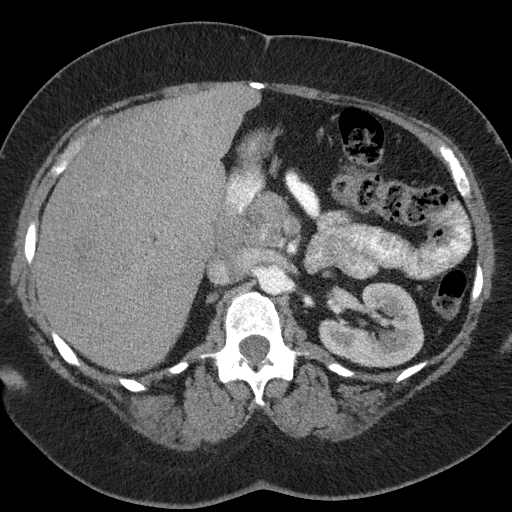
[im 54/95  soft-tissue]
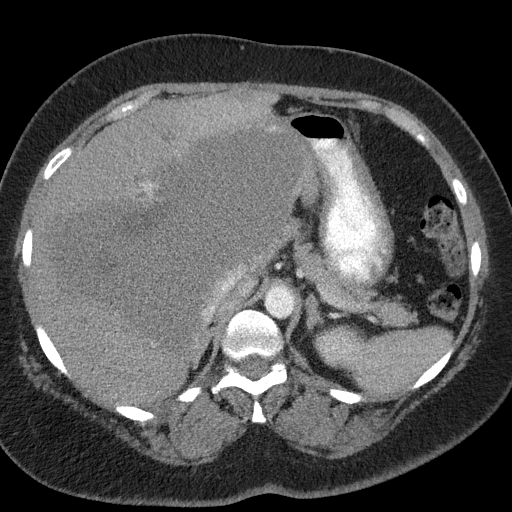
[im 68/95  soft-tissue]
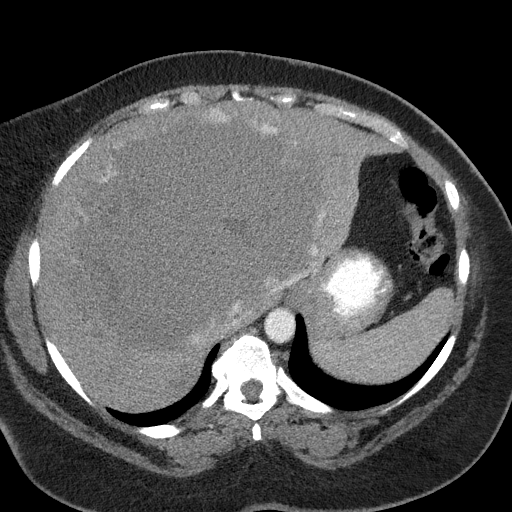
[im 81/95  soft-tissue]
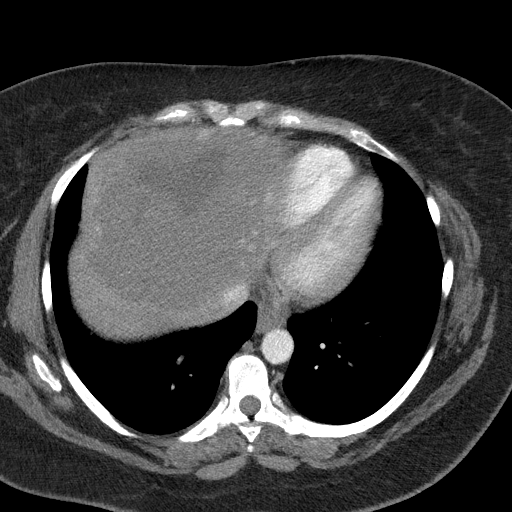

[Series 10: axial venous · axial · portal-venous · 0.66mm/px · z∈[-626,-425]mm · 6 of 95 slices shown, 11 images]
[im 14/95  soft-tissue]
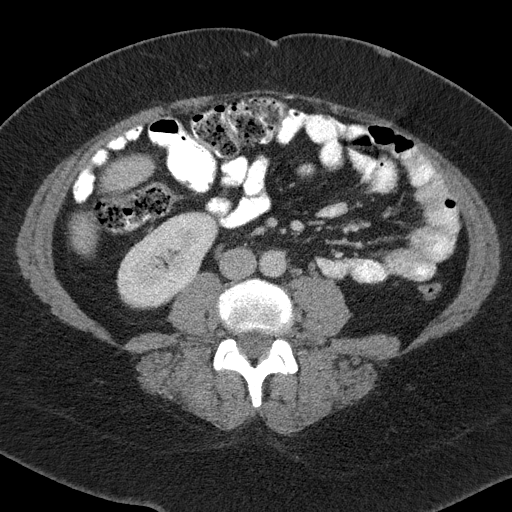
[im 14/95  bone]
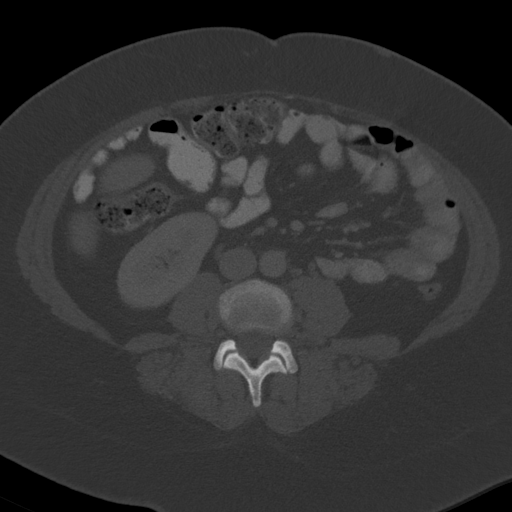
[im 27/95  soft-tissue]
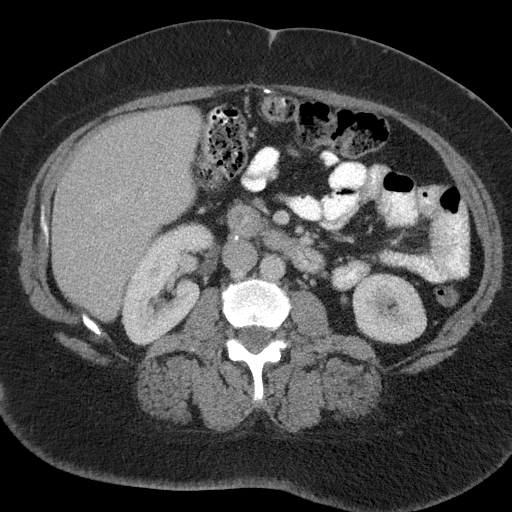
[im 41/95  soft-tissue]
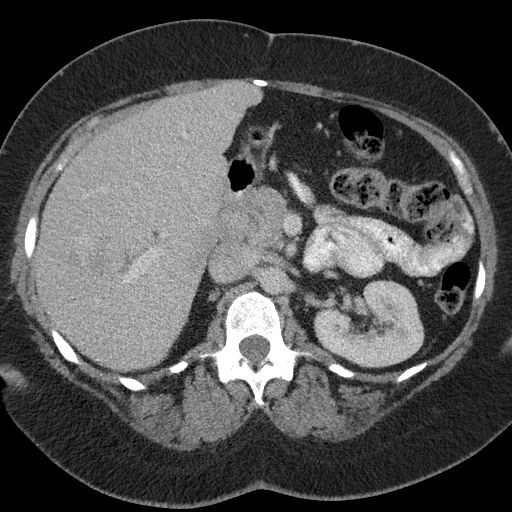
[im 41/95  lung]
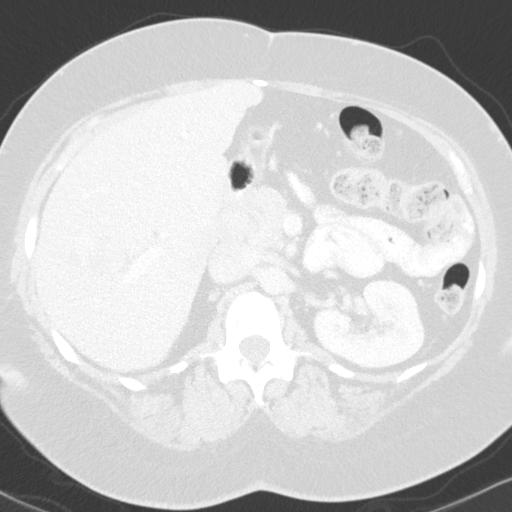
[im 54/95  soft-tissue]
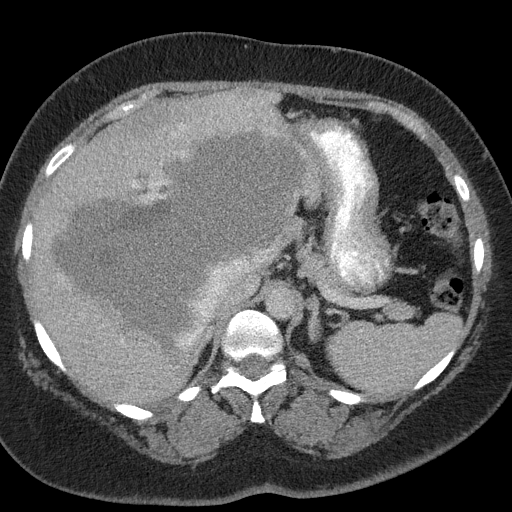
[im 54/95  lung]
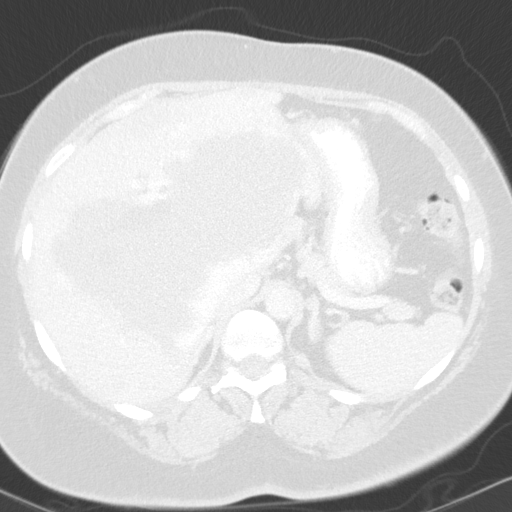
[im 68/95  soft-tissue]
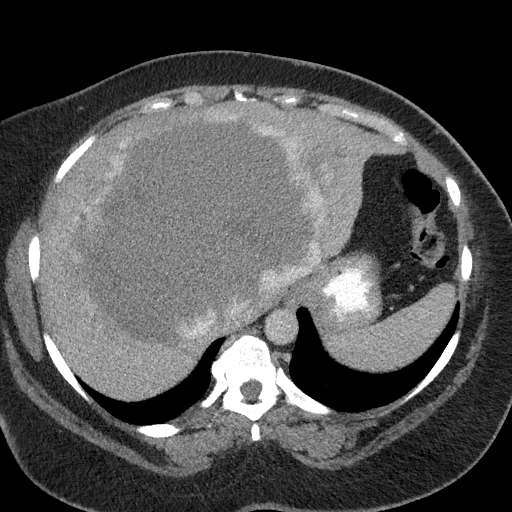
[im 68/95  lung]
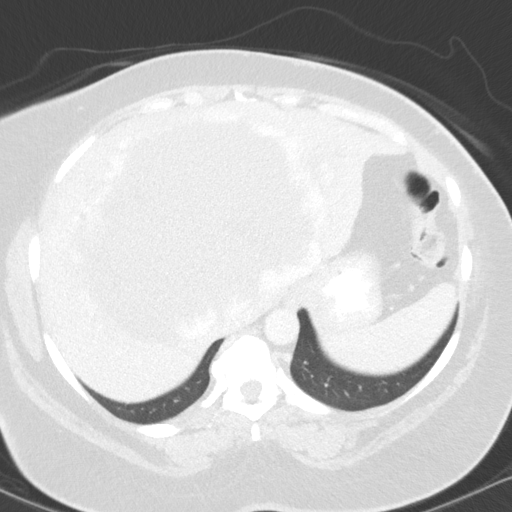
[im 81/95  soft-tissue]
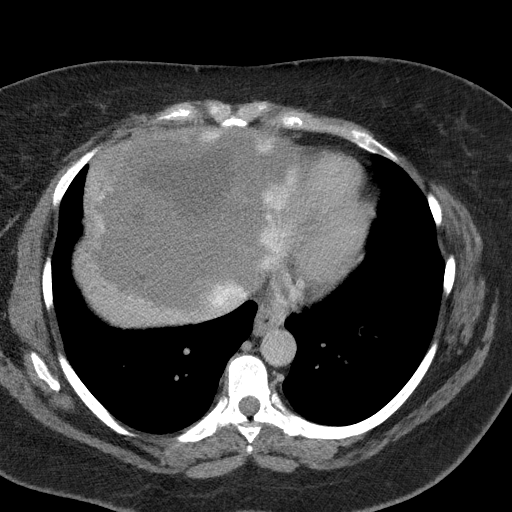
[im 81/95  lung]
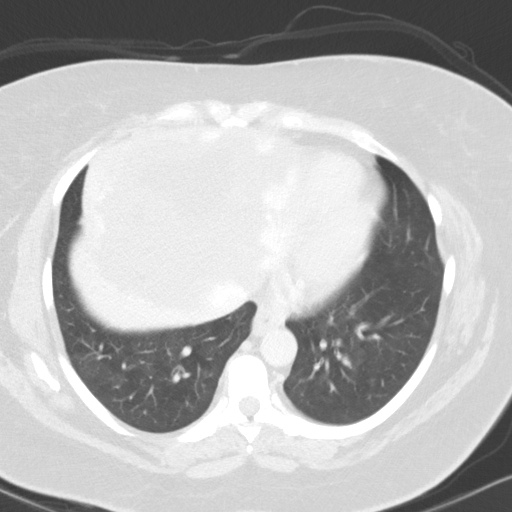

[14 of 32 positions shown; findings below may reference images not displayed]

FINDINGS: Lower chest: No acute findings.

Hepatobiliary: A giant benign hemangioma is seen involving both the
right and left lobes which measures 18.5 by 14.3 cm. No other liver
masses are identified. Prior cholecystectomy. No evidence of biliary
obstruction.

Pancreas:  No mass or inflammatory changes.

Spleen:  Within normal limits in size and appearance.

Adrenals/Urinary Tract: No masses identified. No evidence of
hydronephrosis.

Stomach/Bowel: Visualized portion unremarkable.

Vascular/Lymphatic: No pathologically enlarged lymph nodes
identified. No abdominal aortic aneurysm.

Other:  None.

Musculoskeletal:  No suspicious bone lesions identified.
IMPRESSION: Giant benign hepatic hemangioma measuring 18.5 cm.

No other significant abnormality identified.

## 2019-11-30 ENCOUNTER — Ambulatory Visit: Payer: Medicaid Other | Admitting: Nurse Practitioner

## 2019-12-03 ENCOUNTER — Telehealth: Payer: Self-pay

## 2019-12-03 NOTE — Telephone Encounter (Signed)
Patient cancelled appointment on 12/07/2019, patient moved and changed providers. klh

## 2019-12-07 ENCOUNTER — Ambulatory Visit: Payer: Medicaid Other | Admitting: Nurse Practitioner

## 2019-12-25 ENCOUNTER — Telehealth: Payer: Self-pay

## 2019-12-25 NOTE — Telephone Encounter (Signed)
Completed record request and mailed requested records to [place].

## 2020-09-27 LAB — COLOGUARD: COLOGUARD: NEGATIVE

## 2022-02-10 ENCOUNTER — Ambulatory Visit: Payer: Medicaid Other | Admitting: Nurse Practitioner

## 2022-02-10 ENCOUNTER — Encounter: Payer: Self-pay | Admitting: Nurse Practitioner

## 2022-02-10 VITALS — BP 141/82 | HR 55 | Temp 98.2°F | Resp 16 | Ht 71.0 in | Wt 242.2 lb

## 2022-02-10 DIAGNOSIS — Z760 Encounter for issue of repeat prescription: Secondary | ICD-10-CM

## 2022-02-10 DIAGNOSIS — R7303 Prediabetes: Secondary | ICD-10-CM

## 2022-02-10 DIAGNOSIS — Z7189 Other specified counseling: Secondary | ICD-10-CM | POA: Diagnosis not present

## 2022-02-10 DIAGNOSIS — M150 Primary generalized (osteo)arthritis: Secondary | ICD-10-CM

## 2022-02-10 MED ORDER — DICLOFENAC SODIUM 1 % EX GEL: 4.0000 g | Freq: Four times a day (QID) | CUTANEOUS | 3 refills | Status: DC

## 2022-02-10 MED ORDER — LIDOCAINE 5 % EX PTCH: 1.0000 | MEDICATED_PATCH | CUTANEOUS | 3 refills | Status: DC

## 2022-02-10 NOTE — Progress Notes (Signed)
[facility] [place], [place] [address]  Internal MEDICINE  Office Visit Note  Patient Name: Kara Perez  [id number]  [id number]  Date of Service: 02/10/2022  Chief Complaint  Patient presents with   Follow-up   Diabetes    Last done 8/23 A1c 6.1    HPI Kara Perez presents for an office visit to reestablish care. Her last office visit at [facility] was in may 2021.  --has been on [place] for her diabetes but wants to switch to ozempic if possible, wants to lose 10 more lbs. Need updated labs prior to changing her diabetes medications. Last A1c was in August and was 6.1 which is stable.  BP is slightly elevated, will monitor BP and check at next visit.  --patient requesting STD testing at her next visit.  Due for routine labs and annual wellness visit and physical exam. She wants to get back into a routine and take care of her health and preventive screenings.     Current Medication: Outpatient Encounter Medications as of 02/10/2022  Medication Sig   Accu-Chek Softclix Lancets lancets Use a directed twice a day E11.65   B Complex Vitamins (VITAMIN B COMPLEX PO) Take by mouth. Liquid OTC   Calcium Carb-Cholecalciferol 600-10 MG-MCG TABS Take 1 tablet by mouth daily.   Cholecalciferol 125 MCG (5000 UT) TABS Take by mouth.   fluticasone (FLONASE) 50 MCG/ACT nasal spray USE 1 SPRAY(S) IN EACH NOSTRIL ONCE DAILY   Insulin Pen Needle (B-D ULTRAFINE III SHORT PEN) 31G X 8 MM MISC daily.   loratadine (CLARITIN) 10 MG tablet Take 10 mg by mouth daily.   [DISCONTINUED] diclofenac Sodium (VOLTAREN) 1 % GEL 4 (four) times daily.   [DISCONTINUED] lidocaine (LIDODERM) 5 % Place 1 patch onto the skin daily.   [DISCONTINUED] liraglutide (VICTOZA) 18 MG/3ML SOPN INJECT 1.8 MG SUBCUTANEOUSLY ONCE DAILY   [DISCONTINUED] meloxicam (MOBIC) 7.5 MG tablet Take 1 tablet (7.5 mg total) by mouth daily.   [DISCONTINUED] phentermine (ADIPEX-P) 37.5 MG tablet Take 1  tablet (37.5 mg total) by mouth daily before breakfast.   diclofenac Sodium (VOLTAREN) 1 % GEL Apply 4 g topically 4 (four) times daily.   lidocaine (LIDODERM) 5 % Place 1 patch onto the skin daily.   No facility-administered encounter medications on file as of 02/10/2022.    Surgical History: Past Surgical History:  Procedure Laterality Date   ABDOMINAL HYSTERECTOMY     CESAREAN SECTION     lacerated liver      Medical History: Past Medical History:  Diagnosis Date   Diabetes mellitus without complication ([place])    Prediabetes     Family History: Family History  Problem Relation Age of Onset   Diabetes Mother    Hypertension Father    Liver disease Father    Diabetes Sister    Breast cancer Paternal Grandmother 59    Social History   Socioeconomic History   Marital status: Single    Spouse name: Not on file   Number of children: Not on file   Years of education: Not on file   Highest education level: Not on file  Occupational History   Not on file  Tobacco Use   Smoking status: Never   Smokeless tobacco: Never  Vaping Use   Vaping Use: Never used  Substance and Sexual Activity   Alcohol use: Yes    Comment: ocassional   Drug use: Never   Sexual activity: Not on file  Other Topics  Concern   Not on file  Social History Narrative   Not on file   Social Determinants of Health   Financial Resource Strain: Not on file  Food Insecurity: Not on file  Transportation Needs: Not on file  Physical Activity: Not on file  Stress: Not on file  Social Connections: Not on file  Intimate Partner Violence: Not on file      Review of Systems  Constitutional:  Negative for chills, fatigue and unexpected weight change.  HENT:  Negative for congestion, rhinorrhea, sneezing and sore throat.   Eyes:  Negative for redness.  Respiratory:  Negative for cough, chest tightness and shortness of breath.   Cardiovascular:  Negative for chest pain and palpitations.   Gastrointestinal:  Negative for abdominal pain, constipation, diarrhea, nausea and vomiting.  Genitourinary:  Negative for dysuria and frequency.  Musculoskeletal:  Negative for arthralgias, back pain, joint swelling and neck pain.  Skin:  Negative for rash.  Neurological: Negative.  Negative for tremors and numbness.  Hematological:  Negative for adenopathy. Does not bruise/bleed easily.  Psychiatric/Behavioral:  Negative for behavioral problems (Depression), sleep disturbance and suicidal ideas. The patient is not nervous/anxious.     Vital Signs: BP (!) 141/82   Pulse (!) 55   Temp 98.2 F (36.8 C)   Resp 16   Ht '5\' 11"'$  (1.803 m)   Wt 242 lb 3.2 oz (109.9 kg)   SpO2 97%   BMI 33.78 kg/m    Physical Exam Vitals reviewed.  Constitutional:      General: She is not in acute distress.    Appearance: Normal appearance. She is obese. She is not ill-appearing.  HENT:     Head: Normocephalic and atraumatic.  Eyes:     Pupils: Pupils are equal, round, and reactive to light.  Cardiovascular:     Rate and Rhythm: Normal rate and regular rhythm.  Pulmonary:     Effort: Pulmonary effort is normal. No respiratory distress.  Neurological:     Mental Status: She is alert and oriented to person, place, and time.  Psychiatric:        Mood and Affect: Mood normal.        Behavior: Behavior normal.        Assessment/Plan: 1. Prediabetes Last A1c is 6.1, will need to repeat A1c in late November. Wants to switch diabetes medications. Need updated labs prior to changing medications, will order labs at next office visit.   2. Primary generalized (osteo)arthritis Osteoarthritis history , has had some office visits with orthopedic specialist over the past year and some imaging done. This problem is stable and manageable at this time.   3. Medication refill - loratadine (CLARITIN) 10 MG tablet; Take 10 mg by mouth daily. - Accu-Chek Softclix Lancets lancets; Use a directed twice a day  E11.65 - Insulin Pen Needle (B-D ULTRAFINE III SHORT PEN) 31G X 8 MM MISC; daily. - Calcium Carb-Cholecalciferol 600-10 MG-MCG TABS; Take 1 tablet by mouth daily. - fluticasone (FLONASE) 50 MCG/ACT nasal spray; USE 1 SPRAY(S) IN EACH NOSTRIL ONCE DAILY - Cholecalciferol 125 MCG (5000 UT) TABS; Take by mouth. - B Complex Vitamins (VITAMIN B COMPLEX PO); Take by mouth. Liquid OTC - lidocaine (LIDODERM) 5 %; Place 1 patch onto the skin daily.  Dispense: 30 patch; Refill: 3 - diclofenac Sodium (VOLTAREN) 1 % GEL; Apply 4 g topically 4 (four) times daily.  Dispense: 100 g; Refill: 3  4. Counseling on health promotion and disease prevention Is a  patient at [facility] but was under a different provider's care in 2021. Has not had any consistent medical care since may 2021. Patient has some medical problems that need monitoring such as prediabetes. She is also of recommended age for routine screenings for breast cancer, colorectal cancer and should have an annual physical exam and labs. She agrees with these recommended interventions and is motivated to take care of her health.    General Counseling: Kara Perez verbalizes understanding of the findings of todays visit and agrees with plan of treatment. I have discussed any further diagnostic evaluation that may be needed or ordered today. We also reviewed her medications today. she has been encouraged to call the office with any questions or concerns that should arise related to todays visit.    No orders of the defined types were placed in this encounter.   Meds ordered this encounter  Medications   lidocaine (LIDODERM) 5 %    Sig: Place 1 patch onto the skin daily.    Dispense:  30 patch    Refill:  3   diclofenac Sodium (VOLTAREN) 1 % GEL    Sig: Apply 4 g topically 4 (four) times daily.    Dispense:  100 g    Refill:  3    Return in about 6 weeks (around 03/24/2022) for F/U, Rosanne Wohlfarth PCP  and also needs CPE at earliest available. .   Total  time spent:30 Minutes Time spent includes review of chart, medications, test results, and follow up plan with the patient.   [place] Controlled Substance Database was reviewed by me.  This patient was seen by Jonetta Osgood, FNP-C in collaboration with Dr. Clayborn Bigness as a part of collaborative care agreement.   Jordyn Hofacker R. Valetta Fuller, MSN, FNP-C Internal medicine

## 2022-03-17 ENCOUNTER — Telehealth: Payer: Self-pay | Admitting: Nurse Practitioner

## 2022-03-17 NOTE — Telephone Encounter (Signed)
Left vm and sent mychart message to confirm 03/24/22 appointment-Toni

## 2022-03-24 ENCOUNTER — Encounter: Payer: Self-pay | Admitting: Nurse Practitioner

## 2022-03-24 ENCOUNTER — Ambulatory Visit: Payer: Medicaid Other | Admitting: Nurse Practitioner

## 2022-03-24 VITALS — BP 130/72 | HR 70 | Temp 98.2°F | Resp 16 | Ht 71.0 in | Wt 252.4 lb

## 2022-03-24 DIAGNOSIS — Z0001 Encounter for general adult medical examination with abnormal findings: Secondary | ICD-10-CM | POA: Diagnosis not present

## 2022-03-24 DIAGNOSIS — E559 Vitamin D deficiency, unspecified: Secondary | ICD-10-CM

## 2022-03-24 DIAGNOSIS — K760 Fatty (change of) liver, not elsewhere classified: Secondary | ICD-10-CM

## 2022-03-24 DIAGNOSIS — D729 Disorder of white blood cells, unspecified: Secondary | ICD-10-CM

## 2022-03-24 DIAGNOSIS — R300 Dysuria: Secondary | ICD-10-CM

## 2022-03-24 DIAGNOSIS — E119 Type 2 diabetes mellitus without complications: Secondary | ICD-10-CM

## 2022-03-24 MED ORDER — OZEMPIC (1 MG/DOSE) 4 MG/3ML ~~LOC~~ SOPN: 1.0000 mg | PEN_INJECTOR | SUBCUTANEOUS | 2 refills | Status: DC

## 2022-03-24 NOTE — Progress Notes (Signed)
[facility] [place], [place] [address]  Internal MEDICINE  Office Visit Note  Patient Name: Kara Perez  [id number]  [id number]  Date of Service: 03/24/2022  Chief Complaint  Patient presents with   Annual Exam   Diabetes    HPI Vaness presents for an annual well visit and physical exam.  Well-appearing 51 year old female with osteoarthritis, diabetes, NAFLD, and vitamin D deficiency. --Cologuard negative April 2022, repeat in 2025 --mammogram done in June this year --on Ozempic, started 0.5 dose today  --Echocardiogram needed -- heart stopped and restarted 4x in 2013 July heart palpitations and thrill, occasionally has chest pain with exertion. Has not had a cardiac evaluation since this happened. --due for labs    Current Medication: Outpatient Encounter Medications as of 03/24/2022  Medication Sig   Accu-Chek Softclix Lancets lancets Use a directed twice a day E11.65   B Complex Vitamins (VITAMIN B COMPLEX PO) Take by mouth. Liquid OTC   Calcium Carb-Cholecalciferol 600-10 MG-MCG TABS Take 1 tablet by mouth daily.   Cholecalciferol 125 MCG (5000 UT) TABS Take by mouth.   diclofenac Sodium (VOLTAREN) 1 % GEL Apply 4 g topically 4 (four) times daily.   fluticasone (FLONASE) 50 MCG/ACT nasal spray USE 1 SPRAY(S) IN EACH NOSTRIL ONCE DAILY   Insulin Pen Needle (B-D ULTRAFINE III SHORT PEN) 31G X 8 MM MISC daily.   lidocaine (LIDODERM) 5 % Place 1 patch onto the skin daily.   loratadine (CLARITIN) 10 MG tablet Take 10 mg by mouth daily.   Semaglutide, 1 MG/DOSE, (OZEMPIC, 1 MG/DOSE,) 4 MG/3ML SOPN Inject 1 mg into the skin once a week.   [DISCONTINUED] liraglutide (VICTOZA) 18 MG/3ML SOPN INJECT 1.8 MG SUBCUTANEOUSLY ONCE DAILY   No facility-administered encounter medications on file as of 03/24/2022.    Surgical History: Past Surgical History:  Procedure Laterality Date   ABDOMINAL HYSTERECTOMY     CESAREAN SECTION     lacerated liver       Medical History: Past Medical History:  Diagnosis Date   Diabetes mellitus without complication ([place])    Prediabetes     Family History: Family History  Problem Relation Age of Onset   Diabetes Mother    Hypertension Father    Liver disease Father    Diabetes Sister    Breast cancer Paternal Grandmother 44    Social History   Socioeconomic History   Marital status: Single    Spouse name: Not on file   Number of children: Not on file   Years of education: Not on file   Highest education level: Not on file  Occupational History   Not on file  Tobacco Use   Smoking status: Never   Smokeless tobacco: Never  Vaping Use   Vaping Use: Never used  Substance and Sexual Activity   Alcohol use: Yes    Comment: ocassional   Drug use: Never   Sexual activity: Not on file  Other Topics Concern   Not on file  Social History Narrative   Not on file   Social Determinants of Health   Financial Resource Strain: Not on file  Food Insecurity: Not on file  Transportation Needs: Not on file  Physical Activity: Not on file  Stress: Not on file  Social Connections: Not on file  Intimate Partner Violence: Not on file      Review of Systems  Constitutional:  Negative for activity change, appetite change, chills, fatigue, fever and unexpected weight  change.  HENT: Negative.  Negative for congestion, ear pain, rhinorrhea, sore throat and trouble swallowing.   Eyes: Negative.   Respiratory: Negative.  Negative for cough, chest tightness, shortness of breath and wheezing.   Cardiovascular: Negative.  Negative for chest pain and palpitations.  Gastrointestinal: Negative.  Negative for abdominal pain, blood in stool, constipation, diarrhea, nausea and vomiting.  Endocrine: Negative.   Genitourinary: Negative.  Negative for difficulty urinating, dysuria, frequency, hematuria and urgency.  Musculoskeletal: Negative.  Negative for arthralgias, back pain, joint swelling, myalgias and  neck pain.  Skin: Negative.  Negative for rash and wound.  Allergic/Immunologic: Negative.  Negative for immunocompromised state.  Neurological: Negative.  Negative for dizziness, seizures, numbness and headaches.  Hematological: Negative.   Psychiatric/Behavioral: Negative.  Negative for behavioral problems, self-injury and suicidal ideas. The patient is not nervous/anxious.     Vital Signs: BP 130/72 Comment: 152/81  Pulse 70   Temp 98.2 F (36.8 C)   Resp 16   Ht 5' 11"  (1.803 m)   Wt 252 lb 6.4 oz (114.5 kg)   SpO2 99%   BMI 35.20 kg/m    Physical Exam Vitals reviewed.  Constitutional:      General: She is awake. She is not in acute distress.    Appearance: Normal appearance. She is well-developed and well-groomed. She is obese. She is not ill-appearing or diaphoretic.  HENT:     Head: Normocephalic and atraumatic.     Right Ear: Tympanic membrane, ear canal and external ear normal.     Left Ear: Tympanic membrane, ear canal and external ear normal.     Nose: Nose normal. No congestion or rhinorrhea.     Mouth/Throat:     Lips: Pink.     Pharynx: Oropharynx is clear. Uvula midline. No oropharyngeal exudate or posterior oropharyngeal erythema.  Eyes:     General: Lids are normal. Vision grossly intact. Gaze aligned appropriately. No scleral icterus.       Right eye: No discharge.        Left eye: No discharge.     Extraocular Movements: Extraocular movements intact.     Conjunctiva/sclera: Conjunctivae normal.     Pupils: Pupils are equal, round, and reactive to light.  Neck:     Thyroid: No thyromegaly.     Vascular: No JVD.     Trachea: Trachea and phonation normal. No tracheal deviation.  Cardiovascular:     Rate and Rhythm: Normal rate and regular rhythm.     Pulses:          Dorsalis pedis pulses are 2+ on the right side and 2+ on the left side.       Posterior tibial pulses are 1+ on the right side and 1+ on the left side.     Heart sounds: Normal heart  sounds, S1 normal and S2 normal. No murmur heard.    No friction rub. No gallop.  Pulmonary:     Effort: Pulmonary effort is normal. No accessory muscle usage or respiratory distress.     Breath sounds: Normal breath sounds and air entry. No stridor. No wheezing or rales.  Chest:     Chest wall: No tenderness.  Breasts:    Breasts are symmetrical.     Right: Normal.     Left: Normal.  Abdominal:     General: Bowel sounds are normal. There is no distension.     Palpations: Abdomen is soft. There is no mass.     Tenderness: There  is no abdominal tenderness. There is no guarding or rebound.  Musculoskeletal:        General: No tenderness or deformity. Normal range of motion.     Cervical back: Normal range of motion and neck supple.     Right lower leg: No edema.     Left lower leg: No edema.     Right foot: Normal range of motion. No deformity, bunion, Charcot foot, foot drop or prominent metatarsal heads.     Left foot: Normal range of motion. No deformity, bunion, Charcot foot, foot drop or prominent metatarsal heads.  Feet:     Right foot:     Protective Sensation: 6 sites tested.  6 sites sensed.     Skin integrity: Callus and dry skin present. No ulcer, blister, skin breakdown, erythema, warmth or fissure.     Toenail Condition: Right toenails are abnormally thick.     Left foot:     Protective Sensation: 6 sites tested.  6 sites sensed.     Skin integrity: Callus and dry skin present. No ulcer, blister, skin breakdown, erythema, warmth or fissure.     Toenail Condition: Left toenails are abnormally thick.  Lymphadenopathy:     Cervical: No cervical adenopathy.  Skin:    General: Skin is warm and dry.     Capillary Refill: Capillary refill takes less than 2 seconds.     Coloration: Skin is not pale.     Findings: No erythema or rash.  Neurological:     Mental Status: She is alert and oriented to person, place, and time.     Cranial Nerves: No cranial nerve deficit.      Motor: No abnormal muscle tone.     Coordination: Coordination normal.     Deep Tendon Reflexes: Reflexes are normal and symmetric.  Psychiatric:        Mood and Affect: Mood normal.        Behavior: Behavior normal. Behavior is cooperative.        Thought Content: Thought content normal.        Judgment: Judgment normal.        Assessment/Plan: 1. Encounter for routine adult health examination with abnormal findings Age-appropriate preventive screenings and vaccinations discussed, annual physical exam completed. Routine labs for health maintenance ordered, see below. PHM updated.  - CBC with Differential/Platelet - CMP14+EGFR - Lipid Profile - Hgb A1C w/o eAG - Vitamin D (25 hydroxy) - TSH + free T4  2. Type 2 diabetes mellitus without complication, without long-term current use of insulin (HCC) Increase ozempic dose to 22mg weekly. Routine labs ordered - Semaglutide, 1 MG/DOSE, (OZEMPIC, 1 MG/DOSE,) 4 MG/3ML SOPN; Inject 1 mg into the skin once a week.  Dispense: 3 mL; Refill: 2 - CMP14+EGFR - Lipid Profile - Hgb A1C w/o eAG - TSH + free T4  3. NAFLD (nonalcoholic fatty liver disease) Routine labs ordered - CMP14+EGFR - Lipid Profile - Hgb A1C w/o eAG - TSH + free T4  4. Vitamin D deficiency Routien lab ordered - Vitamin D (25 hydroxy)  5. White blood cell abnormality Routine CBC ordered - CBC with Differential/Platelet  6. Dysuria Routine urinalysis done - UA/M w/rflx Culture, Routine - Microscopic Examination - Urine Culture, Reflex     General Counseling: Bindi verbalizes understanding of the findings of todays visit and agrees with plan of treatment. I have discussed any further diagnostic evaluation that may be needed or ordered today. We also reviewed her medications today. she has  been encouraged to call the office with any questions or concerns that should arise related to todays visit.    Orders Placed This Encounter  Procedures   CBC with  Differential/Platelet   CMP14+EGFR   Lipid Profile   Hgb A1C w/o eAG   Vitamin D (25 hydroxy)   TSH + free T4    Meds ordered this encounter  Medications   Semaglutide, 1 MG/DOSE, (OZEMPIC, 1 MG/DOSE,) 4 MG/3ML SOPN    Sig: Inject 1 mg into the skin once a week.    Dispense:  3 mL    Refill:  2    Dx code E11.65    Return in about 2 months (around 05/24/2022) for F/U, Labs, Weight loss, Tisha Cline PCP.   Total time spent:30 Minutes Time spent includes review of chart, medications, test results, and follow up plan with the patient.   [place] Controlled Substance Database was reviewed by me.  This patient was seen by Jonetta Osgood, FNP-C in collaboration with Dr. Clayborn Bigness as a part of collaborative care agreement.  Usher Hedberg R. Valetta Fuller, MSN, FNP-C Internal medicine

## 2022-03-25 ENCOUNTER — Telehealth: Payer: Self-pay

## 2022-03-25 NOTE — Telephone Encounter (Signed)
Pt advised  that PA is approved for Ozempic

## 2022-03-27 LAB — UA/M W/RFLX CULTURE, ROUTINE
Bilirubin, UA: NEGATIVE
Glucose, UA: NEGATIVE
Ketones, UA: NEGATIVE
Nitrite, UA: NEGATIVE
RBC, UA: NEGATIVE
Specific Gravity, UA: 1.017 (ref 1.005–1.030)
Urobilinogen, Ur: 1 mg/dL (ref 0.2–1.0)
pH, UA: 5.5 (ref 5.0–7.5)

## 2022-03-27 LAB — URINE CULTURE, REFLEX

## 2022-03-27 LAB — MICROSCOPIC EXAMINATION
Casts: NONE SEEN /lpf
RBC, Urine: NONE SEEN /hpf (ref 0–2)

## 2022-04-10 ENCOUNTER — Encounter: Payer: Self-pay | Admitting: Nurse Practitioner

## 2022-04-13 ENCOUNTER — Telehealth: Payer: Self-pay

## 2022-04-13 NOTE — Telephone Encounter (Signed)
Patient's P.A. for Ozempic was approved.

## 2022-04-16 ENCOUNTER — Telehealth: Payer: Self-pay

## 2022-04-16 ENCOUNTER — Other Ambulatory Visit: Payer: Self-pay | Admitting: Nurse Practitioner

## 2022-04-16 DIAGNOSIS — N390 Urinary tract infection, site not specified: Secondary | ICD-10-CM

## 2022-04-16 MED ORDER — AMPICILLIN 500 MG PO CAPS: 500.0000 mg | ORAL_CAPSULE | Freq: Four times a day (QID) | ORAL | 0 refills | Status: AC

## 2022-04-16 NOTE — Telephone Encounter (Signed)
Pt called for UA result advised as per alyssa that she is send antibiotic due to pt is having UTI symptoms

## 2022-05-08 ENCOUNTER — Encounter: Payer: Self-pay | Admitting: Nurse Practitioner

## 2022-05-08 DIAGNOSIS — E119 Type 2 diabetes mellitus without complications: Secondary | ICD-10-CM | POA: Insufficient documentation

## 2022-05-08 DIAGNOSIS — K760 Fatty (change of) liver, not elsewhere classified: Secondary | ICD-10-CM | POA: Insufficient documentation

## 2022-05-08 LAB — CBC WITH DIFFERENTIAL/PLATELET
Basophils Absolute: 0 10*3/uL (ref 0.0–0.2)
Basos: 1 %
EOS (ABSOLUTE): 0.1 10*3/uL (ref 0.0–0.4)
Eos: 3 %
Hematocrit: 42.9 % (ref 34.0–46.6)
Hemoglobin: 13.5 g/dL (ref 11.1–15.9)
Immature Grans (Abs): 0 10*3/uL (ref 0.0–0.1)
Immature Granulocytes: 0 %
Lymphocytes Absolute: 0.8 10*3/uL (ref 0.7–3.1)
Lymphs: 24 %
MCH: 28.8 pg (ref 26.6–33.0)
MCHC: 31.5 g/dL (ref 31.5–35.7)
MCV: 92 fL (ref 79–97)
Monocytes Absolute: 0.2 10*3/uL (ref 0.1–0.9)
Monocytes: 6 %
Neutrophils Absolute: 2.3 10*3/uL (ref 1.4–7.0)
Neutrophils: 66 %
Platelets: 206 10*3/uL (ref 150–450)
RBC: 4.69 x10E6/uL (ref 3.77–5.28)
RDW: 13 % (ref 11.7–15.4)
WBC: 3.5 10*3/uL (ref 3.4–10.8)

## 2022-05-08 LAB — CMP14+EGFR
ALT: 14 IU/L (ref 0–32)
AST: 17 IU/L (ref 0–40)
Albumin/Globulin Ratio: 1.7 (ref 1.2–2.2)
Albumin: 4.7 g/dL (ref 3.8–4.9)
Alkaline Phosphatase: 129 IU/L — ABNORMAL HIGH (ref 44–121)
BUN/Creatinine Ratio: 16 (ref 9–23)
BUN: 13 mg/dL (ref 6–24)
Bilirubin Total: 0.5 mg/dL (ref 0.0–1.2)
CO2: 20 mmol/L (ref 20–29)
Calcium: 9.3 mg/dL (ref 8.7–10.2)
Chloride: 101 mmol/L (ref 96–106)
Creatinine, Ser: 0.81 mg/dL (ref 0.57–1.00)
Globulin, Total: 2.7 g/dL (ref 1.5–4.5)
Glucose: 93 mg/dL (ref 70–99)
Potassium: 4.2 mmol/L (ref 3.5–5.2)
Sodium: 140 mmol/L (ref 134–144)
Total Protein: 7.4 g/dL (ref 6.0–8.5)
eGFR: 87 mL/min/{1.73_m2} (ref >59)

## 2022-05-08 LAB — TSH+FREE T4
Free T4: 1.29 ng/dL (ref 0.82–1.77)
TSH: 1.35 u[IU]/mL (ref 0.450–4.500)

## 2022-05-08 LAB — LIPID PANEL
Chol/HDL Ratio: 3.7 ratio (ref 0.0–4.4)
Cholesterol, Total: 240 mg/dL — ABNORMAL HIGH (ref 100–199)
HDL: 65 mg/dL (ref >39)
LDL Chol Calc (NIH): 159 mg/dL — ABNORMAL HIGH (ref 0–99)
Triglycerides: 94 mg/dL (ref 0–149)
VLDL Cholesterol Cal: 16 mg/dL (ref 5–40)

## 2022-05-08 LAB — VITAMIN D 25 HYDROXY (VIT D DEFICIENCY, FRACTURES): Vit D, 25-Hydroxy: 24.9 ng/mL — ABNORMAL LOW (ref 30.0–100.0)

## 2022-05-08 LAB — HGB A1C W/O EAG: Hgb A1c MFr Bld: 6.3 % — ABNORMAL HIGH (ref 4.8–5.6)

## 2022-05-14 ENCOUNTER — Ambulatory Visit: Payer: Medicaid Other | Admitting: Nurse Practitioner

## 2022-05-14 ENCOUNTER — Encounter: Payer: Self-pay | Admitting: Nurse Practitioner

## 2022-05-14 VITALS — BP 138/72 | HR 60 | Temp 97.5°F | Resp 16 | Ht 71.0 in | Wt 255.8 lb

## 2022-05-14 DIAGNOSIS — N390 Urinary tract infection, site not specified: Secondary | ICD-10-CM

## 2022-05-14 DIAGNOSIS — E119 Type 2 diabetes mellitus without complications: Secondary | ICD-10-CM | POA: Diagnosis not present

## 2022-05-14 DIAGNOSIS — B951 Streptococcus, group B, as the cause of diseases classified elsewhere: Secondary | ICD-10-CM

## 2022-05-14 DIAGNOSIS — B379 Candidiasis, unspecified: Secondary | ICD-10-CM | POA: Diagnosis not present

## 2022-05-14 DIAGNOSIS — T3695XA Adverse effect of unspecified systemic antibiotic, initial encounter: Secondary | ICD-10-CM

## 2022-05-14 DIAGNOSIS — Z6835 Body mass index (BMI) 35.0-35.9, adult: Secondary | ICD-10-CM

## 2022-05-14 LAB — POCT URINALYSIS DIPSTICK
Bilirubin, UA: NEGATIVE
Glucose, UA: NEGATIVE
Ketones, UA: NEGATIVE
Leukocytes, UA: NEGATIVE
Nitrite, UA: NEGATIVE
Protein, UA: NEGATIVE
Spec Grav, UA: 1.01 (ref 1.010–1.025)
Urobilinogen, UA: 0.2 E.U./dL
pH, UA: 7.5 (ref 5.0–8.0)

## 2022-05-14 MED ORDER — FLUCONAZOLE 150 MG PO TABS: 150.0000 mg | ORAL_TABLET | Freq: Once | ORAL | 0 refills | Status: AC

## 2022-05-14 NOTE — Progress Notes (Unsigned)
[facility] [address] [place], [place] [address]  Internal MEDICINE  Office Visit Note  Patient Name: Kara Perez  [id number]  [id number]  Date of Service: 05/14/2022  Chief Complaint  Patient presents with   Follow-up    Weight loss    HPI Kara Perez presents for a follow up visit for weight loss, diabetes and lab results Diabetes Weight loss Lab results:  A1c is 6.3 Vitamin D low 24.9 Total cholesterol 240 and LDL 159 CBC normal CMP grossly normal   Current Medication: Outpatient Encounter Medications as of 05/14/2022  Medication Sig   Accu-Chek Softclix Lancets lancets Use a directed twice a day E11.65   B Complex Vitamins (VITAMIN B COMPLEX PO) Take by mouth. Liquid OTC   Calcium Carb-Cholecalciferol 600-10 MG-MCG TABS Take 1 tablet by mouth daily.   Cholecalciferol 125 MCG (5000 UT) TABS Take by mouth.   diclofenac Sodium (VOLTAREN) 1 % GEL Apply 4 g topically 4 (four) times daily.   fluconazole (DIFLUCAN) 150 MG tablet Take 1 tablet (150 mg total) by mouth once for 1 dose. May take an additional dose after 3 days if still symptomatic.   fluticasone (FLONASE) 50 MCG/ACT nasal spray USE 1 SPRAY(S) IN EACH NOSTRIL ONCE DAILY   Insulin Pen Needle (B-D ULTRAFINE III SHORT PEN) 31G X 8 MM MISC daily.   lidocaine (LIDODERM) 5 % Place 1 patch onto the skin daily.   loratadine (CLARITIN) 10 MG tablet Take 10 mg by mouth daily.   Semaglutide, 1 MG/DOSE, (OZEMPIC, 1 MG/DOSE,) 4 MG/3ML SOPN Inject 1 mg into the skin once a week.   No facility-administered encounter medications on file as of 05/14/2022.    Surgical History: Past Surgical History:  Procedure Laterality Date   ABDOMINAL HYSTERECTOMY     CESAREAN SECTION     lacerated liver      Medical History: Past Medical History:  Diagnosis Date   Diabetes mellitus without complication ([name])    Prediabetes     Family History: Family History  Problem Relation Age of Onset   Diabetes Mother     Hypertension Father    Liver disease Father    Diabetes Sister    Breast cancer Paternal Grandmother 69    Social History   Socioeconomic History   Marital status: Single    Spouse name: Not on file   Number of children: Not on file   Years of education: Not on file   Highest education level: Not on file  Occupational History   Not on file  Tobacco Use   Smoking status: Never   Smokeless tobacco: Never  Vaping Use   Vaping Use: Never used  Substance and Sexual Activity   Alcohol use: Yes    Comment: ocassional   Drug use: Never   Sexual activity: Not on file  Other Topics Concern   Not on file  Social History Narrative   Not on file   Social Determinants of Health   Financial Resource Strain: Not on file  Food Insecurity: Not on file  Transportation Needs: Not on file  Physical Activity: Not on file  Stress: Not on file  Social Connections: Not on file  Intimate Partner Violence: Not on file      Review of Systems  Vital Signs: BP 138/72   Pulse 60   Temp (!) 97.5 F (36.4 C)   Resp 16   Ht '5\' 11"'$  (1.803 m)   Wt 255 lb 12.8 oz (116 kg)  SpO2 95%   BMI 35.68 kg/m    Physical Exam     Assessment/Plan:   General Counseling: Kara Perez verbalizes understanding of the findings of todays visit and agrees with plan of treatment. I have discussed any further diagnostic evaluation that may be needed or ordered today. We also reviewed her medications today. she has been encouraged to call the office with any questions or concerns that should arise related to todays visit.    Orders Placed This Encounter  Procedures   CULTURE, URINE COMPREHENSIVE   POCT urinalysis dipstick    Meds ordered this encounter  Medications   fluconazole (DIFLUCAN) 150 MG tablet    Sig: Take 1 tablet (150 mg total) by mouth once for 1 dose. May take an additional dose after 3 days if still symptomatic.    Dispense:  3 tablet    Refill:  0    Return in about 2 months  (around 07/15/2022) for F/U, Weight loss, Deng Kemler PCP.   Total time spent:*** Minutes Time spent includes review of chart, medications, test results, and follow up plan with the patient.   [place] Controlled Substance Database was reviewed by me.  This patient was seen by Jonetta Osgood, FNP-C in collaboration with Dr. Clayborn Bigness as a part of collaborative care agreement.   Petros Ahart R. Valetta Fuller, MSN, FNP-C Internal medicine

## 2022-05-15 ENCOUNTER — Encounter: Payer: Self-pay | Admitting: Nurse Practitioner

## 2022-05-20 LAB — CULTURE, URINE COMPREHENSIVE

## 2022-07-06 ENCOUNTER — Other Ambulatory Visit: Payer: Self-pay | Admitting: Nurse Practitioner

## 2022-07-06 DIAGNOSIS — E119 Type 2 diabetes mellitus without complications: Secondary | ICD-10-CM

## 2022-07-09 ENCOUNTER — Ambulatory Visit: Payer: Medicaid Other | Admitting: Nurse Practitioner

## 2022-07-09 ENCOUNTER — Encounter: Payer: Self-pay | Admitting: Nurse Practitioner

## 2022-07-09 VITALS — BP 134/80 | HR 60 | Temp 97.5°F | Resp 16 | Ht 71.0 in | Wt 260.0 lb

## 2022-07-09 DIAGNOSIS — R300 Dysuria: Secondary | ICD-10-CM

## 2022-07-09 DIAGNOSIS — M150 Primary generalized (osteo)arthritis: Secondary | ICD-10-CM | POA: Diagnosis not present

## 2022-07-09 DIAGNOSIS — E119 Type 2 diabetes mellitus without complications: Secondary | ICD-10-CM | POA: Diagnosis not present

## 2022-07-09 DIAGNOSIS — Z113 Encounter for screening for infections with a predominantly sexual mode of transmission: Secondary | ICD-10-CM

## 2022-07-09 DIAGNOSIS — R053 Chronic cough: Secondary | ICD-10-CM | POA: Diagnosis not present

## 2022-07-09 DIAGNOSIS — E66812 Obesity, class 2: Secondary | ICD-10-CM

## 2022-07-09 DIAGNOSIS — Z6835 Body mass index (BMI) 35.0-35.9, adult: Secondary | ICD-10-CM

## 2022-07-09 LAB — POCT URINALYSIS DIPSTICK
Bilirubin, UA: NEGATIVE
Blood, UA: NEGATIVE
Glucose, UA: NEGATIVE
Ketones, UA: NEGATIVE
Leukocytes, UA: NEGATIVE
Nitrite, UA: NEGATIVE
Protein, UA: NEGATIVE
Spec Grav, UA: 1.01 (ref 1.010–1.025)
Urobilinogen, UA: 0.2 E.U./dL
pH, UA: 6.5 (ref 5.0–8.0)

## 2022-07-09 MED ORDER — TIRZEPATIDE 5 MG/0.5ML ~~LOC~~ SOAJ: 5.0000 mg | SUBCUTANEOUS | 5 refills | Status: DC

## 2022-07-09 MED ORDER — LIDOCAINE 5 % EX PTCH: 1.0000 | MEDICATED_PATCH | CUTANEOUS | 3 refills | Status: DC

## 2022-07-09 MED ORDER — DICLOFENAC SODIUM 1 % EX GEL: 4.0000 g | Freq: Four times a day (QID) | CUTANEOUS | 3 refills | Status: DC

## 2022-07-09 NOTE — Progress Notes (Signed)
[facility] [place], [place] [address]  Internal MEDICINE  Office Visit Note  Patient Name: Kara Perez  [id number]  [id number]  Date of Service: 07/09/2022  Chief Complaint  Patient presents with   Follow-up    Weight loss, med refill.    Diabetes    HPI Kara Perez presents for a follow-up visit for diabetes, arthritis, STD testing Diabetes -- has been on ozempic, gaining weight still. Was previously on victoza. Insurance now prefers [employer].  BP elevated but improved when rechecked Plantar fasciitis -- left foot-- uses voltaren gel and rest and elevation.  Persistent cough, nonproductive x3 weeks Arthritis -- needs refills  Starting new relationship, wants STD testing ordered.    Current Medication: Outpatient Encounter Medications as of 07/09/2022  Medication Sig   Accu-Chek Softclix Lancets lancets Use a directed twice a day E11.65   B Complex Vitamins (VITAMIN B COMPLEX PO) Take by mouth. Liquid OTC   Calcium Carb-Cholecalciferol 600-10 MG-MCG TABS Take 1 tablet by mouth daily.   Cholecalciferol 125 MCG (5000 UT) TABS Take by mouth.   fluticasone (FLONASE) 50 MCG/ACT nasal spray USE 1 SPRAY(S) IN EACH NOSTRIL ONCE DAILY   loratadine (CLARITIN) 10 MG tablet Take 10 mg by mouth daily.   tirzepatide [facility]) 5 MG/0.5ML Pen Inject 5 mg into the skin once a week.   [DISCONTINUED] diclofenac Sodium (VOLTAREN) 1 % GEL Apply 4 g topically 4 (four) times daily.   [DISCONTINUED] Insulin Pen Needle (B-D ULTRAFINE III SHORT PEN) 31G X 8 MM MISC daily.   [DISCONTINUED] lidocaine (LIDODERM) 5 % Place 1 patch onto the skin daily.   [DISCONTINUED] OZEMPIC, 1 MG/DOSE, 4 MG/3ML SOPN INJECT '1MG'$  SUBCUTANEOUSLY ONCE A WEEK   diclofenac Sodium (VOLTAREN) 1 % GEL Apply 4 g topically 4 (four) times daily.   lidocaine (LIDODERM) 5 % Place 1 patch onto the skin daily.   No facility-administered encounter medications on file as of 07/09/2022.    Surgical  History: Past Surgical History:  Procedure Laterality Date   ABDOMINAL HYSTERECTOMY     CESAREAN SECTION     lacerated liver      Medical History: Past Medical History:  Diagnosis Date   Diabetes mellitus without complication ([place])    Prediabetes     Family History: Family History  Problem Relation Age of Onset   Diabetes Mother    Hypertension Father    Liver disease Father    Diabetes Sister    Breast cancer Paternal Grandmother 67    Social History   Socioeconomic History   Marital status: Single    Spouse name: Not on file   Number of children: Not on file   Years of education: Not on file   Highest education level: Not on file  Occupational History   Not on file  Tobacco Use   Smoking status: Never   Smokeless tobacco: Never  Vaping Use   Vaping Use: Never used  Substance and Sexual Activity   Alcohol use: Yes    Comment: ocassional   Drug use: Never   Sexual activity: Not on file  Other Topics Concern   Not on file  Social History Narrative   Not on file   Social Determinants of Health   Financial Resource Strain: Not on file  Food Insecurity: Not on file  Transportation Needs: Not on file  Physical Activity: Not on file  Stress: Not on file  Social Connections: Not on file  Intimate Partner Violence: Not on  file      Review of Systems  Constitutional:  Negative for chills, fatigue and unexpected weight change.  HENT:  Negative for congestion, rhinorrhea, sneezing and sore throat.   Eyes:  Negative for redness.  Respiratory:  Negative for cough, chest tightness and shortness of breath.   Cardiovascular:  Negative for chest pain and palpitations.  Gastrointestinal:  Negative for abdominal pain, constipation, diarrhea, nausea and vomiting.  Genitourinary:  Negative for dysuria and frequency.  Musculoskeletal:  Negative for arthralgias, back pain, joint swelling and neck pain.  Skin:  Negative for rash.  Neurological: Negative.  Negative for  tremors and numbness.  Hematological:  Negative for adenopathy. Does not bruise/bleed easily.  Psychiatric/Behavioral:  Negative for behavioral problems (Depression), sleep disturbance and suicidal ideas. The patient is not nervous/anxious.     Vital Signs: BP 134/80 Comment: 145/90  Pulse 60   Temp (!) 97.5 F (36.4 C)   Resp 16   Ht '5\' 11"'$  (1.803 m)   Wt 260 lb (117.9 kg)   SpO2 97%   BMI 36.26 kg/m    Physical Exam Vitals reviewed.  Constitutional:      General: She is not in acute distress.    Appearance: Normal appearance. She is obese. She is not ill-appearing.  HENT:     Head: Normocephalic and atraumatic.  Eyes:     Pupils: Pupils are equal, round, and reactive to light.  Cardiovascular:     Rate and Rhythm: Normal rate and regular rhythm.  Pulmonary:     Effort: Pulmonary effort is normal. No respiratory distress.  Neurological:     Mental Status: She is alert and oriented to person, place, and time.  Psychiatric:        Mood and Affect: Mood normal.        Behavior: Behavior normal.        Assessment/Plan: 1. Type 2 diabetes mellitus without complication, without long-term current use of insulin (HCC) Discontinue ozempic, start mounjaro and take as prescribed. Follow up in 2 months for A1c check - tirzepatide (MOUNJARO) 5 MG/0.5ML Pen; Inject 5 mg into the skin once a week.  Dispense: 2 mL; Refill: 5  2. Persistent cough for 3 weeks or longer Continue to monitor cough, if it continues and gets worse, call clinic  3. Primary generalized (osteo)arthritis Refills of topical medication and lidocaine patches.  - diclofenac Sodium (VOLTAREN) 1 % GEL; Apply 4 g topically 4 (four) times daily.  Dispense: 100 g; Refill: 3 - lidocaine (LIDODERM) 5 %; Place 1 patch onto the skin daily.  Dispense: 30 patch; Refill: 3  4. Dysuria Negative urinalysis  - POCT Urinalysis Dipstick  5. Class 2 severe obesity due to excess calories with serious comorbidity and body  mass index (BMI) of 35.0 to 35.9 in adult ([place]) Ozempic discontinued, mounjaro prescribed for diabetes and will help with weight loss as well  6. Screening for STDs (sexually transmitted diseases) STD testing ordered for blood and urine - STI Profile - Chlamydia/Gonococcus/Trichomonas, NAA   General Counseling: Kara Perez verbalizes understanding of the findings of todays visit and agrees with plan of treatment. I have discussed any further diagnostic evaluation that may be needed or ordered today. We also reviewed her medications today. she has been encouraged to call the office with any questions or concerns that should arise related to todays visit.    Orders Placed This Encounter  Procedures   Chlamydia/Gonococcus/Trichomonas, NAA   STI Profile    Meds ordered this encounter  Medications   tirzepatide (MOUNJARO) 5 MG/0.5ML Pen    Sig: Inject 5 mg into the skin once a week.    Dispense:  2 mL    Refill:  5    Dx code E11.65; discontinue ozempic please   diclofenac Sodium (VOLTAREN) 1 % GEL    Sig: Apply 4 g topically 4 (four) times daily.    Dispense:  100 g    Refill:  3   lidocaine (LIDODERM) 5 %    Sig: Place 1 patch onto the skin daily.    Dispense:  30 patch    Refill:  3    Return in about 2 months (around 09/07/2022) for F/U, Recheck A1C, Sheila Ocasio PCP.   Total time spent:30 Minutes Time spent includes review of chart, medications, test results, and follow up plan with the patient.   [place] Controlled Substance Database was reviewed by me.  This patient was seen by Jonetta Osgood, FNP-C in collaboration with Dr. Clayborn Bigness as a part of collaborative care agreement.   Manolito Jurewicz R. Valetta Fuller, MSN, FNP-C Internal medicine

## 2022-07-10 LAB — STI PROFILE
HCV Ab: NONREACTIVE
HIV Screen 4th Generation wRfx: NONREACTIVE
Hep B Core Total Ab: NEGATIVE
Hep B Surface Ab, Qual: REACTIVE
Hepatitis B Surface Ag: NEGATIVE
RPR Ser Ql: NONREACTIVE

## 2022-07-10 LAB — HCV INTERPRETATION

## 2022-07-12 ENCOUNTER — Telehealth: Payer: Self-pay

## 2022-07-12 NOTE — Progress Notes (Signed)
All labs were negative for STDs.

## 2022-07-13 ENCOUNTER — Telehealth: Payer: Self-pay

## 2022-07-13 NOTE — Telephone Encounter (Signed)
Pt advised by kiran labs normal

## 2022-07-13 NOTE — Telephone Encounter (Signed)
-----   Message from Jonetta Osgood, NP sent at 07/12/2022  4:51 PM EST ----- All labs were negative for STDs.

## 2022-07-13 NOTE — Telephone Encounter (Signed)
Spoke with patient regarding lab results. 

## 2022-07-13 NOTE — Telephone Encounter (Signed)
Lmom to call us back 

## 2022-07-14 LAB — CHLAMYDIA/GONOCOCCUS/TRICHOMONAS, NAA
Chlamydia by NAA: NEGATIVE
Gonococcus by NAA: NEGATIVE
Trich vag by NAA: NEGATIVE

## 2022-07-16 ENCOUNTER — Ambulatory Visit: Payer: Medicaid Other | Admitting: Nurse Practitioner

## 2022-07-20 ENCOUNTER — Telehealth: Payer: Self-pay

## 2022-07-20 NOTE — Telephone Encounter (Signed)
Sent P.A. for patient's Mounjaro.

## 2022-07-23 ENCOUNTER — Encounter: Payer: Self-pay | Admitting: Nurse Practitioner

## 2022-07-23 ENCOUNTER — Telehealth: Payer: Self-pay | Admitting: Nurse Practitioner

## 2022-07-23 ENCOUNTER — Ambulatory Visit: Payer: Medicaid Other | Admitting: Nurse Practitioner

## 2022-07-23 VITALS — BP 160/88 | HR 60 | Temp 97.4°F | Resp 16 | Ht 71.0 in | Wt 257.6 lb

## 2022-07-23 DIAGNOSIS — F5104 Psychophysiologic insomnia: Secondary | ICD-10-CM

## 2022-07-23 DIAGNOSIS — I10 Essential (primary) hypertension: Secondary | ICD-10-CM | POA: Diagnosis not present

## 2022-07-23 DIAGNOSIS — F410 Panic disorder [episodic paroxysmal anxiety] without agoraphobia: Secondary | ICD-10-CM

## 2022-07-23 DIAGNOSIS — F4311 Post-traumatic stress disorder, acute: Secondary | ICD-10-CM | POA: Diagnosis not present

## 2022-07-23 MED ORDER — TRAZODONE HCL 50 MG PO TABS: 25.0000 mg | ORAL_TABLET | Freq: Every evening | ORAL | 3 refills | Status: DC | PRN

## 2022-07-23 MED ORDER — ALPRAZOLAM 0.25 MG PO TABS: 0.2500 mg | ORAL_TABLET | Freq: Two times a day (BID) | ORAL | 0 refills | Status: DC | PRN
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
## 2022-07-23 NOTE — Telephone Encounter (Signed)
Awaiting 07/23/22 office notes for [facility] referral-Toni

## 2022-07-27 ENCOUNTER — Telehealth: Payer: Self-pay | Admitting: Nurse Practitioner

## 2022-07-27 NOTE — Telephone Encounter (Signed)
[facility] referral faxed to [facility] ; [phone]

## 2022-07-30 ENCOUNTER — Telehealth: Payer: Self-pay

## 2022-08-03 ENCOUNTER — Other Ambulatory Visit: Payer: Self-pay | Admitting: Nurse Practitioner

## 2022-08-03 DIAGNOSIS — E119 Type 2 diabetes mellitus without complications: Secondary | ICD-10-CM

## 2022-08-09 NOTE — Telephone Encounter (Signed)
Patient denied for [facility].

## 2022-08-19 ENCOUNTER — Ambulatory Visit: Payer: Medicaid Other | Admitting: Nurse Practitioner

## 2022-09-09 ENCOUNTER — Ambulatory Visit: Payer: Medicaid Other | Admitting: Nurse Practitioner

## 2022-09-09 ENCOUNTER — Encounter: Payer: Self-pay | Admitting: Nurse Practitioner

## 2022-09-09 VITALS — BP 128/82 | HR 60 | Temp 97.8°F | Resp 16 | Ht 71.0 in | Wt 258.2 lb

## 2022-09-09 DIAGNOSIS — M79672 Pain in left foot: Secondary | ICD-10-CM

## 2022-09-09 DIAGNOSIS — M150 Primary generalized (osteo)arthritis: Secondary | ICD-10-CM

## 2022-09-09 DIAGNOSIS — G8929 Other chronic pain: Secondary | ICD-10-CM

## 2022-09-09 DIAGNOSIS — E119 Type 2 diabetes mellitus without complications: Secondary | ICD-10-CM

## 2022-09-09 DIAGNOSIS — M79605 Pain in left leg: Secondary | ICD-10-CM

## 2022-09-09 DIAGNOSIS — J3089 Other allergic rhinitis: Secondary | ICD-10-CM | POA: Diagnosis not present

## 2022-09-09 DIAGNOSIS — Z760 Encounter for issue of repeat prescription: Secondary | ICD-10-CM

## 2022-09-09 DIAGNOSIS — F5104 Psychophysiologic insomnia: Secondary | ICD-10-CM

## 2022-09-09 LAB — POCT GLYCOSYLATED HEMOGLOBIN (HGB A1C): Hemoglobin A1C: 5.8 % — AB (ref 4.0–5.6)

## 2022-09-09 MED ORDER — TIRZEPATIDE 7.5 MG/0.5ML ~~LOC~~ SOAJ: 7.5000 mg | SUBCUTANEOUS | 2 refills | Status: DC

## 2022-09-09 MED ORDER — DICLOFENAC SODIUM 1 % EX GEL: 4.0000 g | Freq: Four times a day (QID) | CUTANEOUS | 3 refills | Status: DC

## 2022-09-09 MED ORDER — FLUTICASONE PROPIONATE 50 MCG/ACT NA SUSP
NASAL | 5 refills | Status: DC
Start: 2022-09-09 — End: 2023-09-16

## 2022-09-09 MED ORDER — LIDOCAINE 5 % EX PTCH: 1.0000 | MEDICATED_PATCH | CUTANEOUS | 3 refills | Status: DC

## 2022-09-09 MED ORDER — LORATADINE 10 MG PO TABS: 10.0000 mg | ORAL_TABLET | Freq: Every day | ORAL | 3 refills | Status: DC

## 2022-09-09 NOTE — Progress Notes (Signed)
[facility] [address] [place], [place] [address]  Internal MEDICINE  Office Visit Note  Patient Name: Kara Perez  [id number]  [id number]  Date of Service: 09/09/2022  Chief Complaint  Patient presents with   Diabetes   Follow-up    HPI Eulene presents for a follow-up visit for left foot pain, prediabetes, hypertension. Left foot pain that radiated up the leg half up the calf, heel burns and she has difficulty bearing weight on that foot. From a previous injury in 2013, having increased pain for more than 6 months now.  Due for A1c, it is 5.8 today, improved by 0.5 BP slightly elevated, rechecked and improved    Current Medication: Outpatient Encounter Medications as of 09/09/2022  Medication Sig   Accu-Chek Softclix Lancets lancets Use a directed twice a day E11.65   ALPRAZolam (XANAX) 0.25 MG tablet Take 1 tablet (0.25 mg total) by mouth 2 (two) times daily as needed (severe anxiety/panic attack).   B Complex Vitamins (VITAMIN B COMPLEX PO) Take by mouth. Liquid OTC   Calcium Carb-Cholecalciferol 600-10 MG-MCG TABS Take 1 tablet by mouth daily.   Cholecalciferol 125 MCG (5000 UT) TABS Take by mouth.   tirzepatide (MOUNJARO) 7.5 MG/0.5ML Pen Inject 7.5 mg into the skin once a week.   traZODone (DESYREL) 50 MG tablet Take 0.5-1 tablets (25-50 mg total) by mouth at bedtime as needed for sleep.   [DISCONTINUED] diclofenac Sodium (VOLTAREN) 1 % GEL Apply 4 g topically 4 (four) times daily.   [DISCONTINUED] fluticasone (FLONASE) 50 MCG/ACT nasal spray USE 1 SPRAY(S) IN EACH NOSTRIL ONCE DAILY   [DISCONTINUED] lidocaine (LIDODERM) 5 % Place 1 patch onto the skin daily.   [DISCONTINUED] loratadine (CLARITIN) 10 MG tablet Take 10 mg by mouth daily.   [DISCONTINUED] tirzepatide [facility]) 5 MG/0.5ML Pen Inject 5 mg into the skin once a week.   diclofenac Sodium (VOLTAREN) 1 % GEL Apply 4 g topically 4 (four) times daily.   fluticasone (FLONASE) 50 MCG/ACT nasal spray  USE 1 SPRAY(S) IN EACH NOSTRIL ONCE DAILY   lidocaine (LIDODERM) 5 % Place 1 patch onto the skin daily.   loratadine (CLARITIN) 10 MG tablet Take 1 tablet (10 mg total) by mouth daily.   No facility-administered encounter medications on file as of 09/09/2022.    Surgical History: Past Surgical History:  Procedure Laterality Date   ABDOMINAL HYSTERECTOMY     CESAREAN SECTION     lacerated liver      Medical History: Past Medical History:  Diagnosis Date   Diabetes mellitus without complication    Prediabetes     Family History: Family History  Problem Relation Age of Onset   Diabetes Mother    Hypertension Father    Liver disease Father    Diabetes Sister    Breast cancer Paternal Grandmother 95    Social History   Socioeconomic History   Marital status: Single    Spouse name: Not on file   Number of children: Not on file   Years of education: Not on file   Highest education level: Not on file  Occupational History   Not on file  Tobacco Use   Smoking status: Never   Smokeless tobacco: Never  Vaping Use   Vaping Use: Never used  Substance and Sexual Activity   Alcohol use: Yes    Comment: ocassional   Drug use: Never   Sexual activity: Not on file  Other Topics Concern   Not on file  Social History Narrative   Not on file   Social Determinants of Health   Financial Resource Strain: Not on file  Food Insecurity: Not on file  Transportation Needs: Not on file  Physical Activity: Not on file  Stress: Not on file  Social Connections: Not on file  Intimate Partner Violence: Not on file      Review of Systems  Constitutional:  Positive for fatigue. Negative for appetite change, chills and unexpected weight change.  HENT:  Negative for congestion, rhinorrhea, sneezing and sore throat.   Eyes:  Negative for redness.  Respiratory: Negative.  Negative for cough, chest tightness, shortness of breath and wheezing.   Cardiovascular: Negative.  Negative for  chest pain and palpitations.  Gastrointestinal:  Negative for abdominal pain, constipation, diarrhea, nausea and vomiting.  Genitourinary:  Negative for dysuria and frequency.  Musculoskeletal:  Negative for arthralgias, back pain, joint swelling and neck pain.  Skin:  Negative for rash.  Neurological: Negative.  Negative for tremors and numbness.  Hematological:  Negative for adenopathy. Does not bruise/bleed easily.  Psychiatric/Behavioral:  Positive for behavioral problems (Depression) and sleep disturbance. Negative for dysphoric mood, self-injury and suicidal ideas. The patient is nervous/anxious.     Vital Signs: BP 128/82 Comment: 147/83  Pulse 60   Temp 97.8 F (36.6 C)   Resp 16   Ht 5\' 11"  (1.803 m)   Wt 258 lb 3.2 oz (117.1 kg)   SpO2 98%   BMI 36.01 kg/m    Physical Exam Vitals reviewed.  Constitutional:      General: She is not in acute distress.    Appearance: Normal appearance. She is obese. She is not ill-appearing.  HENT:     Head: Normocephalic and atraumatic.  Eyes:     Pupils: Pupils are equal, round, and reactive to light.  Cardiovascular:     Rate and Rhythm: Normal rate and regular rhythm.  Pulmonary:     Effort: Pulmonary effort is normal. No respiratory distress.  Neurological:     Mental Status: She is alert and oriented to person, place, and time.  Psychiatric:        Mood and Affect: Mood is depressed. Mood is not anxious. Affect is not tearful.        Behavior: Behavior normal. Behavior is cooperative.        Assessment/Plan: 1. Type 2 diabetes mellitus without complication, without long-term current use of insulin A1c improved to 5.8. no weight loss since previous visit. Mounjaro dose increased to 7.5 mg weekly.  - tirzepatide (MOUNJARO) 7.5 MG/0.5ML Pen; Inject 7.5 mg into the skin once a week.  Dispense: 6 mL; Refill: 2 - POCT glycosylated hemoglobin (Hb A1C)  2. Chronic foot pain, left Xrays of left ankle and foot ordered to rule  out acute fracture or injury Referred to orthopedic specialist for further evaluation - DG Ankle Complete Left; Future - diclofenac Sodium (VOLTAREN) 1 % GEL; Apply 4 g topically 4 (four) times daily.  Dispense: 100 g; Refill: 3 - DG Foot Complete Left; Future - Ambulatory referral to Orthopedic Surgery  3. Chronic pain of left lower extremity Xrays and referral ordered - DG Ankle Complete Left; Future - diclofenac Sodium (VOLTAREN) 1 % GEL; Apply 4 g topically 4 (four) times daily.  Dispense: 100 g; Refill: 3 - DG Foot Complete Left; Future - Ambulatory referral to Orthopedic Surgery  4. Non-seasonal allergic rhinitis due to other allergic trigger Continue loratadine and flonase as prescribed.  - loratadine (CLARITIN)  10 MG tablet; Take 1 tablet (10 mg total) by mouth daily.  Dispense: 30 tablet; Refill: 3 - fluticasone (FLONASE) 50 MCG/ACT nasal spray; USE 1 SPRAY(S) IN EACH NOSTRIL ONCE DAILY  Dispense: 16 g; Refill: 5  5. Primary generalized (osteo)arthritis Refills ordered - diclofenac Sodium (VOLTAREN) 1 % GEL; Apply 4 g topically 4 (four) times daily.  Dispense: 100 g; Refill: 3 - lidocaine (LIDODERM) 5 %; Place 1 patch onto the skin daily.  Dispense: 30 patch; Refill: 3  6. Psychophysiological insomnia Trouble sleeping since the incident, slightly improved.    General Counseling: Keina verbalizes understanding of the findings of todays visit and agrees with plan of treatment. I have discussed any further diagnostic evaluation that may be needed or ordered today. We also reviewed her medications today. she has been encouraged to call the office with any questions or concerns that should arise related to todays visit.    Orders Placed This Encounter  Procedures   DG Ankle Complete Left   DG Foot Complete Left   Ambulatory referral to Orthopedic Surgery   POCT glycosylated hemoglobin (Hb A1C)    Meds ordered this encounter  Medications   diclofenac Sodium (VOLTAREN) 1  % GEL    Sig: Apply 4 g topically 4 (four) times daily.    Dispense:  100 g    Refill:  3   tirzepatide (MOUNJARO) 7.5 MG/0.5ML Pen    Sig: Inject 7.5 mg into the skin once a week.    Dispense:  6 mL    Refill:  2    Note increased dose, discontinue 5 mg dose and fill new script today.   loratadine (CLARITIN) 10 MG tablet    Sig: Take 1 tablet (10 mg total) by mouth daily.    Dispense:  30 tablet    Refill:  3   lidocaine (LIDODERM) 5 %    Sig: Place 1 patch onto the skin daily.    Dispense:  30 patch    Refill:  3   fluticasone (FLONASE) 50 MCG/ACT nasal spray    Sig: USE 1 SPRAY(S) IN EACH NOSTRIL ONCE DAILY    Dispense:  16 g    Refill:  5    Return in about 4 months (around 01/09/2023) for F/U, Recheck A1C, Jaylinn Hellenbrand PCP, cancel appt on 4/30.   Total time spent:30 Minutes Time spent includes review of chart, medications, test results, and follow up plan with the patient.   [place] Controlled Substance Database was reviewed by me.  This patient was seen by Sallyanne Kuster, FNP-C in collaboration with Dr. Beverely Risen as a part of collaborative care agreement.   Zareya Tuckett R. Tedd Sias, MSN, FNP-C Internal medicine

## 2022-09-10 ENCOUNTER — Telehealth: Payer: Self-pay

## 2022-09-10 ENCOUNTER — Telehealth: Payer: Self-pay | Admitting: Nurse Practitioner

## 2022-09-10 NOTE — Telephone Encounter (Signed)
Awaiting 09/09/22 office notes for Orthopedic referral-Toni 

## 2022-09-10 NOTE — Telephone Encounter (Signed)
PA was done for [facility].

## 2022-09-10 NOTE — Telephone Encounter (Signed)
PA was approved for Mounjaro.  

## 2022-09-13 ENCOUNTER — Ambulatory Visit
Admission: RE | Admit: 2022-09-13 | Discharge: 2022-09-13 | Disposition: A | Discharge status: Home / Self Care | Payer: Medicaid Other | Source: Ambulatory Visit | Attending: Nurse Practitioner | Admitting: Nurse Practitioner

## 2022-09-13 DIAGNOSIS — G8929 Other chronic pain: Secondary | ICD-10-CM | POA: Diagnosis present

## 2022-09-13 DIAGNOSIS — M79672 Pain in left foot: Secondary | ICD-10-CM | POA: Diagnosis present

## 2022-09-13 DIAGNOSIS — M79605 Pain in left leg: Secondary | ICD-10-CM | POA: Insufficient documentation

## 2022-09-17 ENCOUNTER — Ambulatory Visit: Payer: Medicaid Other | Admitting: Nurse Practitioner

## 2022-09-25 ENCOUNTER — Encounter: Payer: Self-pay | Admitting: Nurse Practitioner

## 2022-09-28 ENCOUNTER — Telehealth: Payer: Self-pay | Admitting: Nurse Practitioner

## 2022-09-28 NOTE — Telephone Encounter (Signed)
Orthopedic referral sent via [employer] to Dr. Menz with [facility]-Toni 

## 2022-10-04 ENCOUNTER — Telehealth: Payer: Self-pay | Admitting: Nurse Practitioner

## 2022-10-04 DIAGNOSIS — G8929 Other chronic pain: Secondary | ICD-10-CM

## 2022-10-04 NOTE — Telephone Encounter (Signed)
Received message back from [facility]. They stated patient needs to be seen by podiatry. Sent message to [facility] for new referral-Toni

## 2022-10-05 ENCOUNTER — Ambulatory Visit: Payer: Medicaid Other | Admitting: Nurse Practitioner

## 2022-10-06 ENCOUNTER — Telehealth: Payer: Self-pay | Admitting: Nurse Practitioner

## 2022-10-06 NOTE — Telephone Encounter (Signed)
Orthopedic referral cancelled. Sent via Epic to [facility]

## 2022-10-08 NOTE — Telephone Encounter (Signed)
Error

## 2022-10-29 ENCOUNTER — Ambulatory Visit: Payer: Medicaid Other | Admitting: Podiatry

## 2022-10-29 DIAGNOSIS — M79672 Pain in left foot: Secondary | ICD-10-CM | POA: Diagnosis not present
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
## 2023-01-10 ENCOUNTER — Ambulatory Visit: Payer: Medicaid Other | Admitting: Nurse Practitioner

## 2023-01-14 ENCOUNTER — Encounter: Payer: Self-pay | Admitting: Nurse Practitioner

## 2023-01-14 ENCOUNTER — Ambulatory Visit: Payer: Medicaid Other | Admitting: Nurse Practitioner

## 2023-01-14 VITALS — BP 112/70 | HR 60 | Temp 98.5°F | Resp 16 | Ht 71.0 in | Wt 262.2 lb

## 2023-01-14 DIAGNOSIS — K760 Fatty (change of) liver, not elsewhere classified: Secondary | ICD-10-CM | POA: Diagnosis not present

## 2023-01-14 DIAGNOSIS — E782 Mixed hyperlipidemia: Secondary | ICD-10-CM

## 2023-01-14 DIAGNOSIS — E119 Type 2 diabetes mellitus without complications: Secondary | ICD-10-CM

## 2023-01-14 DIAGNOSIS — M150 Primary generalized (osteo)arthritis: Secondary | ICD-10-CM | POA: Diagnosis not present

## 2023-01-14 DIAGNOSIS — E559 Vitamin D deficiency, unspecified: Secondary | ICD-10-CM

## 2023-01-14 MED ORDER — MELOXICAM 15 MG PO TABS
15.0000 mg | ORAL_TABLET | Freq: Every day | ORAL | 2 refills | Status: DC
Start: 2023-01-14 — End: 2023-03-11

## 2023-01-14 MED ORDER — SEMAGLUTIDE (1 MG/DOSE) 4 MG/3ML ~~LOC~~ SOPN
1.0000 mg | PEN_INJECTOR | SUBCUTANEOUS | 3 refills | Status: DC
Start: 2023-01-14 — End: 2023-03-11

## 2023-01-14 NOTE — Progress Notes (Signed)
[facility] [address] [place], [place] [address]  Internal MEDICINE  Office Visit Note  Patient Name: Kara Perez  [id number]  [id number]  Date of Service: 01/14/2023  Chief Complaint  Patient presents with   Diabetes   Follow-up    HPI Kara Perez presents for a follow-up visit for Diabetes -- stopped taking mounjaro 3 weeks ago, unable to tolerate side effects. Wants to go back to ozempic which did not cause her any problems.  Osteoarthritis -- was on meloxicam previously. Most of her pain is from her hips down.  Fatty liver -- ozempic is beneficial for the liver.   Due to repeat A1c, lipid panel and vitamin D level   Current Medication: Outpatient Encounter Medications as of 01/14/2023  Medication Sig   Accu-Chek Softclix Lancets lancets Use a directed twice a day E11.65   B Complex Vitamins (VITAMIN B COMPLEX PO) Take by mouth. Liquid OTC   Calcium Carb-Cholecalciferol 600-10 MG-MCG TABS Take 1 tablet by mouth daily.   Cholecalciferol 125 MCG (5000 UT) TABS Take by mouth.   diclofenac Sodium (VOLTAREN) 1 % GEL Apply 4 g topically 4 (four) times daily.   fluticasone (FLONASE) 50 MCG/ACT nasal spray USE 1 SPRAY(S) IN EACH NOSTRIL ONCE DAILY   lidocaine (LIDODERM) 5 % Place 1 patch onto the skin daily.   loratadine (CLARITIN) 10 MG tablet Take 1 tablet (10 mg total) by mouth daily.   meloxicam (MOBIC) 15 MG tablet Take 1 tablet (15 mg total) by mouth daily. In am with breakfast   Semaglutide, 1 MG/DOSE, 4 MG/3ML SOPN Inject 1 mg as directed once a week.   [DISCONTINUED] ALPRAZolam (XANAX) 0.25 MG tablet Take 1 tablet (0.25 mg total) by mouth 2 (two) times daily as needed (severe anxiety/panic attack).   [DISCONTINUED] tirzepatide (MOUNJARO) 7.5 MG/0.5ML Pen Inject 7.5 mg into the skin once a week. (Patient not taking: Reported on 01/14/2023)   [DISCONTINUED] traZODone (DESYREL) 50 MG tablet Take 0.5-1 tablets (25-50 mg total) by mouth at bedtime as needed for sleep.    No facility-administered encounter medications on file as of 01/14/2023.    Surgical History: Past Surgical History:  Procedure Laterality Date   ABDOMINAL HYSTERECTOMY     CESAREAN SECTION     lacerated liver      Medical History: Past Medical History:  Diagnosis Date   Diabetes mellitus without complication (HCC)    Prediabetes     Family History: Family History  Problem Relation Age of Onset   Diabetes Mother    Hypertension Father    Liver disease Father    Diabetes Sister    Breast cancer Paternal Grandmother 58    Social History   Socioeconomic History   Marital status: Single    Spouse name: Not on file   Number of children: Not on file   Years of education: Not on file   Highest education level: Not on file  Occupational History   Not on file  Tobacco Use   Smoking status: Never   Smokeless tobacco: Never  Vaping Use   Vaping status: Never Used  Substance and Sexual Activity   Alcohol use: Yes    Comment: ocassional   Drug use: Never   Sexual activity: Not on file  Other Topics Concern   Not on file  Social History Narrative   Not on file   Social Determinants of Health   Financial Resource Strain: Not on file  Food Insecurity: Not on file  Transportation  Needs: Not on file  Physical Activity: Not on file  Stress: Not on file  Social Connections: Unknown (10/19/2021)   Received from [facility]   Social Network    Social Network: Not on file  Intimate Partner Violence: Unknown (09/10/2021)   Received from [facility]   HITS    Physically Hurt: Not on file    Insult or Talk Down To: Not on file    Threaten Physical Harm: Not on file    Scream or Curse: Not on file      Review of Systems  Constitutional:  Positive for fatigue. Negative for appetite change, chills and unexpected weight change.  HENT:  Negative for congestion, rhinorrhea, sneezing and sore throat.   Eyes:  Negative for redness.  Respiratory: Negative.  Negative for  cough, chest tightness, shortness of breath and wheezing.   Cardiovascular: Negative.  Negative for chest pain and palpitations.  Gastrointestinal:  Negative for abdominal pain, constipation, diarrhea, nausea and vomiting.  Genitourinary:  Negative for dysuria and frequency.  Musculoskeletal:  Negative for arthralgias, back pain, joint swelling and neck pain.  Skin:  Negative for rash.  Neurological: Negative.  Negative for tremors and numbness.  Hematological:  Negative for adenopathy. Does not bruise/bleed easily.  Psychiatric/Behavioral:  Positive for behavioral problems (Depression) and sleep disturbance. Negative for dysphoric mood, self-injury and suicidal ideas. The patient is nervous/anxious.     Vital Signs: BP 112/70   Pulse 60   Temp 98.5 F (36.9 C)   Resp 16   Ht 5\' 11"  (1.803 m)   Wt 262 lb 3.2 oz (118.9 kg)   SpO2 95%   BMI 36.57 kg/m    Physical Exam Vitals reviewed.  Constitutional:      General: She is not in acute distress.    Appearance: Normal appearance. She is obese. She is not ill-appearing.  HENT:     Head: Normocephalic and atraumatic.  Eyes:     Pupils: Pupils are equal, round, and reactive to light.  Cardiovascular:     Rate and Rhythm: Normal rate and regular rhythm.  Pulmonary:     Effort: Pulmonary effort is normal. No respiratory distress.  Neurological:     Mental Status: She is alert and oriented to person, place, and time.  Psychiatric:        Mood and Affect: Mood is depressed. Mood is not anxious. Affect is not tearful.        Behavior: Behavior normal. Behavior is cooperative.        Assessment/Plan: 1. Type 2 diabetes mellitus without complication, without long-term current use of insulin (HCC) Discontinue mounjaro now, sample of ozempic provided to patient and will start at 0.5 mg weekly x4 dose then increase to 1 mg weekly. Have labs drawn, follow up in 4 weeks  - Semaglutide, 1 MG/DOSE, 4 MG/3ML SOPN; Inject 1 mg as directed  once a week.  Dispense: 3 mL; Refill: 3 - Hgb A1C w/o eAG  2. NAFLD (nonalcoholic fatty liver disease) Restart ozempic  - Semaglutide, 1 MG/DOSE, 4 MG/3ML SOPN; Inject 1 mg as directed once a week.  Dispense: 3 mL; Refill: 3  3. Primary generalized (osteo)arthritis Start meloxicam once daily for joint pain - meloxicam (MOBIC) 15 MG tablet; Take 1 tablet (15 mg total) by mouth daily. In am with breakfast  Dispense: 30 tablet; Refill: 2  4. Vitamin D deficiency Repeat lab ordered  - Vitamin D (25 hydroxy)  5. Mixed hyperlipidemia Repeat lipid panel.  -  Lipid Profile   General Counseling: Kara Perez verbalizes understanding of the findings of todays visit and agrees with plan of treatment. I have discussed any further diagnostic evaluation that may be needed or ordered today. We also reviewed her medications today. she has been encouraged to call the office with any questions or concerns that should arise related to todays visit.    Orders Placed This Encounter  Procedures   Hgb A1C w/o eAG   Lipid Profile   Vitamin D (25 hydroxy)    Meds ordered this encounter  Medications   Semaglutide, 1 MG/DOSE, 4 MG/3ML SOPN    Sig: Inject 1 mg as directed once a week.    Dispense:  3 mL    Refill:  3    Dx code E11.65   meloxicam (MOBIC) 15 MG tablet    Sig: Take 1 tablet (15 mg total) by mouth daily. In am with breakfast    Dispense:  30 tablet    Refill:  2    Return in about 4 weeks (around 02/11/2023) for F/U, Labs, Kataleyah Carducci PCP.   Total time spent:30 Minutes Time spent includes review of chart, medications, test results, and follow up plan with the patient.   [place] Controlled Substance Database was reviewed by me.  This patient was seen by Sallyanne Kuster, FNP-C in collaboration with Dr. Beverely Risen as a part of collaborative care agreement.   Sailor Haughn R. Tedd Sias, MSN, FNP-C Internal medicine

## 2023-02-11 ENCOUNTER — Ambulatory Visit: Payer: Medicaid Other | Admitting: Nurse Practitioner

## 2023-02-21 ENCOUNTER — Other Ambulatory Visit: Payer: Self-pay | Admitting: Nurse Practitioner

## 2023-02-21 DIAGNOSIS — Z1231 Encounter for screening mammogram for malignant neoplasm of breast: Secondary | ICD-10-CM

## 2023-02-23 ENCOUNTER — Telehealth: Payer: Self-pay | Admitting: Nurse Practitioner

## 2023-02-23 NOTE — Telephone Encounter (Signed)
Lvm to remind pt that labs are not done

## 2023-02-26 LAB — HGB A1C W/O EAG: Hgb A1c MFr Bld: 6.6 % — ABNORMAL HIGH (ref 4.8–5.6)

## 2023-02-26 LAB — LIPID PANEL
Chol/HDL Ratio: 4.1 ratio (ref 0.0–4.4)
Cholesterol, Total: 228 mg/dL — ABNORMAL HIGH (ref 100–199)
HDL: 56 mg/dL (ref >39)
LDL Chol Calc (NIH): 150 mg/dL — ABNORMAL HIGH (ref 0–99)
Triglycerides: 124 mg/dL (ref 0–149)
VLDL Cholesterol Cal: 22 mg/dL (ref 5–40)

## 2023-02-26 LAB — VITAMIN D 25 HYDROXY (VIT D DEFICIENCY, FRACTURES): Vit D, 25-Hydroxy: 23.3 ng/mL — ABNORMAL LOW (ref 30.0–100.0)

## 2023-03-03 ENCOUNTER — Telehealth: Payer: Self-pay | Admitting: Nurse Practitioner

## 2023-03-03 NOTE — Telephone Encounter (Signed)
Lvm & sent mychart msg to either move 03/04/23 appointment, or change to virtual due to weather-[facility]

## 2023-03-04 ENCOUNTER — Ambulatory Visit: Payer: Medicaid Other | Admitting: Nurse Practitioner

## 2023-03-11 ENCOUNTER — Encounter: Payer: Self-pay | Admitting: Nurse Practitioner

## 2023-03-11 ENCOUNTER — Ambulatory Visit: Payer: Medicaid Other | Admitting: Nurse Practitioner

## 2023-03-11 VITALS — BP 120/76 | HR 57 | Temp 98.4°F | Resp 16 | Ht 71.0 in | Wt 262.4 lb

## 2023-03-11 DIAGNOSIS — K760 Fatty (change of) liver, not elsewhere classified: Secondary | ICD-10-CM

## 2023-03-11 DIAGNOSIS — E782 Mixed hyperlipidemia: Secondary | ICD-10-CM

## 2023-03-11 DIAGNOSIS — E1169 Type 2 diabetes mellitus with other specified complication: Secondary | ICD-10-CM | POA: Diagnosis not present

## 2023-03-11 DIAGNOSIS — R300 Dysuria: Secondary | ICD-10-CM | POA: Diagnosis not present

## 2023-03-11 DIAGNOSIS — E559 Vitamin D deficiency, unspecified: Secondary | ICD-10-CM | POA: Diagnosis not present

## 2023-03-11 DIAGNOSIS — E119 Type 2 diabetes mellitus without complications: Secondary | ICD-10-CM

## 2023-03-11 DIAGNOSIS — E785 Hyperlipidemia, unspecified: Secondary | ICD-10-CM

## 2023-03-11 DIAGNOSIS — M150 Primary generalized (osteo)arthritis: Secondary | ICD-10-CM | POA: Diagnosis not present

## 2023-03-11 LAB — POCT URINALYSIS DIPSTICK
Bilirubin, UA: NEGATIVE
Blood, UA: NEGATIVE
Glucose, UA: NEGATIVE
Ketones, UA: NEGATIVE
Leukocytes, UA: NEGATIVE
Nitrite, UA: NEGATIVE
Protein, UA: NEGATIVE
Spec Grav, UA: 1.01 (ref 1.010–1.025)
Urobilinogen, UA: 0.2 U/dL
pH, UA: 5 (ref 5.0–8.0)

## 2023-03-11 MED ORDER — MELOXICAM 15 MG PO TABS
15.0000 mg | ORAL_TABLET | Freq: Every day | ORAL | 2 refills | Status: DC
Start: 2023-03-11 — End: 2023-06-28

## 2023-03-11 MED ORDER — VITAMIN D (ERGOCALCIFEROL) 1.25 MG (50000 UNIT) PO CAPS: 50000.0000 [IU] | ORAL_CAPSULE | ORAL | 1 refills | Status: DC

## 2023-03-11 MED ORDER — SEMAGLUTIDE (2 MG/DOSE) 8 MG/3ML ~~LOC~~ SOPN
2.0000 mg | PEN_INJECTOR | SUBCUTANEOUS | 5 refills | Status: DC
Start: 2023-03-11 — End: 2023-09-16

## 2023-03-11 NOTE — Progress Notes (Signed)
[facility] [address] [place], [place] [address]  Internal MEDICINE  Office Visit Note  Patient Name: Kara Perez  [id number]  [id number]  Date of Service: 03/11/2023  Chief Complaint  Patient presents with   Follow-up   Diabetes    HPI Kara Perez presents for a follow-up visit for  Low vitamin D  Diabetes -- A1c 6.6. Weight loss -- goal is to go to go back to gym starting November.  High cholesterol --   Current Medication: Outpatient Encounter Medications as of 03/11/2023  Medication Sig   Accu-Chek Softclix Lancets lancets Use a directed twice a day E11.65   B Complex Vitamins (VITAMIN B COMPLEX PO) Take by mouth. Liquid OTC   Calcium Carb-Cholecalciferol 600-10 MG-MCG TABS Take 1 tablet by mouth daily.   Cholecalciferol 125 MCG (5000 UT) TABS Take by mouth.   diclofenac Sodium (VOLTAREN) 1 % GEL Apply 4 g topically 4 (four) times daily.   fluticasone (FLONASE) 50 MCG/ACT nasal spray USE 1 SPRAY(S) IN EACH NOSTRIL ONCE DAILY   lidocaine (LIDODERM) 5 % Place 1 patch onto the skin daily.   loratadine (CLARITIN) 10 MG tablet Take 1 tablet (10 mg total) by mouth daily.   meloxicam (MOBIC) 15 MG tablet Take 1 tablet (15 mg total) by mouth daily. In am with breakfast   Semaglutide, 1 MG/DOSE, 4 MG/3ML SOPN Inject 1 mg as directed once a week.   No facility-administered encounter medications on file as of 03/11/2023.    Surgical History: Past Surgical History:  Procedure Laterality Date   ABDOMINAL HYSTERECTOMY     CESAREAN SECTION     lacerated liver      Medical History: Past Medical History:  Diagnosis Date   Diabetes mellitus without complication (HCC)    Prediabetes     Family History: Family History  Problem Relation Age of Onset   Diabetes Mother    Hypertension Father    Liver disease Father    Diabetes Sister    Breast cancer Paternal Grandmother 10    Social History   Socioeconomic History   Marital status: Single    Spouse  name: Not on file   Number of children: Not on file   Years of education: Not on file   Highest education level: Not on file  Occupational History   Not on file  Tobacco Use   Smoking status: Never   Smokeless tobacco: Never  Vaping Use   Vaping status: Never Used  Substance and Sexual Activity   Alcohol use: Yes    Comment: ocassional   Drug use: Never   Sexual activity: Not on file  Other Topics Concern   Not on file  Social History Narrative   Not on file   Social Determinants of Health   Financial Resource Strain: Not on file  Food Insecurity: Not on file  Transportation Needs: Not on file  Physical Activity: Not on file  Stress: Not on file  Social Connections: Unknown (10/19/2021)   Received from [facility]   Social Network    Social Network: Not on file  Intimate Partner Violence: Unknown (09/10/2021)   Received from [facility], [facility]   HITS    Physically Hurt: Not on file    Insult or Talk Down To: Not on file    Threaten Physical Harm: Not on file    Scream or Curse: Not on file      Review of Systems  Vital Signs: BP 120/76  Pulse (!) 57   Temp 98.4 F (36.9 C)   Resp 16   Ht 5\' 11"  (1.803 m)   Wt 262 lb 6.4 oz (119 kg)   SpO2 96%   BMI 36.60 kg/m    Physical Exam     Assessment/Plan:   General Counseling: [name] verbalizes understanding of the findings of todays visit and agrees with plan of treatment. I have discussed any further diagnostic evaluation that may be needed or ordered today. We also reviewed her medications today. she has been encouraged to call the office with any questions or concerns that should arise related to todays visit.    Orders Placed This Encounter  Procedures   POCT Urinalysis Dipstick    No orders of the defined types were placed in this encounter.   No follow-ups on file.   Total time spent:*** Minutes Time spent includes review of chart, medications, test results, and  follow up plan with the patient.   [place] Controlled Substance Database was reviewed by me.  This patient was seen by Sallyanne Kuster, FNP-C in collaboration with Dr. Beverely Risen as a part of collaborative care agreement.   Luka Reisch R. Tedd Sias, MSN, FNP-C Internal medicine

## 2023-03-12 ENCOUNTER — Encounter: Payer: Self-pay | Admitting: Nurse Practitioner

## 2023-03-28 ENCOUNTER — Telehealth: Payer: Self-pay | Admitting: Nurse Practitioner

## 2023-03-28 NOTE — Telephone Encounter (Signed)
Left vm and sent mychart message to confirm 04/04/23 appointment-[facility]oni

## 2023-03-31 ENCOUNTER — Encounter: Payer: Medicaid Other | Admitting: Nurse Practitioner

## 2023-04-02 LAB — CMP14+EGFR
ALT: 16 [IU]/L (ref 0–32)
AST: 16 [IU]/L (ref 0–40)
Albumin: 4.4 g/dL (ref 3.8–4.9)
Alkaline Phosphatase: 129 [IU]/L — ABNORMAL HIGH (ref 44–121)
BUN/Creatinine Ratio: 20 (ref 9–23)
BUN: 13 mg/dL (ref 6–24)
Bilirubin Total: 0.4 mg/dL (ref 0.0–1.2)
CO2: 22 mmol/L (ref 20–29)
Calcium: 8.9 mg/dL (ref 8.7–10.2)
Chloride: 103 mmol/L (ref 96–106)
Creatinine, Ser: 0.64 mg/dL (ref 0.57–1.00)
Globulin, Total: 2.8 g/dL (ref 1.5–4.5)
Glucose: 99 mg/dL (ref 70–99)
Potassium: 4.3 mmol/L (ref 3.5–5.2)
Sodium: 139 mmol/L (ref 134–144)
Total Protein: 7.2 g/dL (ref 6.0–8.5)
eGFR: 105 mL/min/{1.73_m2} (ref >59)

## 2023-04-02 LAB — CBC WITH DIFFERENTIAL/PLATELET
Basophils Absolute: 0 10*3/uL (ref 0.0–0.2)
Basos: 1 %
EOS (ABSOLUTE): 0.1 10*3/uL (ref 0.0–0.4)
Eos: 3 %
Hematocrit: 42.5 % (ref 34.0–46.6)
Hemoglobin: 13.5 g/dL (ref 11.1–15.9)
Immature Grans (Abs): 0 10*3/uL (ref 0.0–0.1)
Immature Granulocytes: 0 %
Lymphocytes Absolute: 0.8 10*3/uL (ref 0.7–3.1)
Lymphs: 22 %
MCH: 29.5 pg (ref 26.6–33.0)
MCHC: 31.8 g/dL (ref 31.5–35.7)
MCV: 93 fL (ref 79–97)
Monocytes Absolute: 0.2 10*3/uL (ref 0.1–0.9)
Monocytes: 5 %
Neutrophils Absolute: 2.6 10*3/uL (ref 1.4–7.0)
Neutrophils: 69 %
Platelets: 215 10*3/uL (ref 150–450)
RBC: 4.57 x10E6/uL (ref 3.77–5.28)
RDW: 13.3 % (ref 11.7–15.4)
WBC: 3.7 10*3/uL (ref 3.4–10.8)

## 2023-04-04 ENCOUNTER — Encounter: Payer: Self-pay | Admitting: Nurse Practitioner

## 2023-04-04 ENCOUNTER — Ambulatory Visit: Payer: Medicaid Other | Admitting: Nurse Practitioner

## 2023-04-04 ENCOUNTER — Ambulatory Visit
Admission: RE | Admit: 2023-04-04 | Discharge: 2023-04-04 | Disposition: A | Discharge status: Home / Self Care | Payer: Medicare HMO | Source: Ambulatory Visit | Attending: Nurse Practitioner | Admitting: Nurse Practitioner

## 2023-04-04 VITALS — BP 138/80 | HR 63 | Temp 98.4°F | Resp 16 | Ht 71.0 in | Wt 265.2 lb

## 2023-04-04 DIAGNOSIS — Z1231 Encounter for screening mammogram for malignant neoplasm of breast: Secondary | ICD-10-CM | POA: Diagnosis present

## 2023-04-04 DIAGNOSIS — E785 Hyperlipidemia, unspecified: Secondary | ICD-10-CM

## 2023-04-04 DIAGNOSIS — Z0001 Encounter for general adult medical examination with abnormal findings: Secondary | ICD-10-CM

## 2023-04-04 DIAGNOSIS — M150 Primary generalized (osteo)arthritis: Secondary | ICD-10-CM | POA: Diagnosis not present

## 2023-04-04 DIAGNOSIS — K760 Fatty (change of) liver, not elsewhere classified: Secondary | ICD-10-CM | POA: Diagnosis not present

## 2023-04-04 DIAGNOSIS — E1169 Type 2 diabetes mellitus with other specified complication: Secondary | ICD-10-CM | POA: Diagnosis not present

## 2023-04-04 MED ORDER — DAPAGLIFLOZIN PROPANEDIOL 10 MG PO TABS: 10.0000 mg | ORAL_TABLET | Freq: Every day | ORAL | 4 refills | Status: DC

## 2023-04-04 NOTE — Progress Notes (Signed)
[facility] [address] [place], [place] [address]  Internal MEDICINE  Office Visit Note  Patient Name: Kara Perez  [id number]  [id number]  Date of Service: 04/04/2023  Chief Complaint  Patient presents with   Diabetes   Annual Exam    HPI Timmy presents for an annual well visit and physical exam.  Well-appearing 52 y.o. female with osteoarthritis, diabetes, NAFLD, and vitamin D deficiency.  Routine CRC screening: repeat cologuard due next year Routine mammogram:  DEXA scan: due now, scheduled for 10/31 Pap smear: hysterectomy Eye exam: due for eye exam  foot exam: done  Labs: labs done prior to office visit, results discussed with patient.  New or worsening pain: none  Other concerns: none     Current Medication: Outpatient Encounter Medications as of 04/04/2023  Medication Sig   Accu-Chek Softclix Lancets lancets Use a directed twice a day E11.65   B Complex Vitamins (VITAMIN B COMPLEX PO) Take by mouth. Liquid OTC   Calcium Carb-Cholecalciferol 600-10 MG-MCG TABS Take 1 tablet by mouth daily.   diclofenac Sodium (VOLTAREN) 1 % GEL Apply 4 g topically 4 (four) times daily.   fluticasone (FLONASE) 50 MCG/ACT nasal spray USE 1 SPRAY(S) IN EACH NOSTRIL ONCE DAILY   lidocaine (LIDODERM) 5 % Place 1 patch onto the skin daily.   loratadine (CLARITIN) 10 MG tablet Take 1 tablet (10 mg total) by mouth daily.   meloxicam (MOBIC) 15 MG tablet Take 1 tablet (15 mg total) by mouth daily. In am with breakfast   Semaglutide, 2 MG/DOSE, 8 MG/3ML SOPN Inject 2 mg as directed once a week.   Vitamin D, Ergocalciferol, (DRISDOL) 1.25 MG (50000 UNIT) CAPS capsule Take 1 capsule (50,000 Units total) by mouth every 7 (seven) days.   [DISCONTINUED] dapagliflozin propanediol (FARXIGA) 10 MG TABS tablet Take 1 tablet (10 mg total) by mouth daily.   No facility-administered encounter medications on file as of 04/04/2023.    Surgical History: Past Surgical History:   Procedure Laterality Date   ABDOMINAL HYSTERECTOMY     CESAREAN SECTION     lacerated liver      Medical History: Past Medical History:  Diagnosis Date   Diabetes mellitus without complication (HCC)    Prediabetes     Family History: Family History  Problem Relation Age of Onset   Diabetes Mother    Hypertension Father    Liver disease Father    Diabetes Sister    Breast cancer Paternal Grandmother 35    Social History   Socioeconomic History   Marital status: Single    Spouse name: Not on file   Number of children: Not on file   Years of education: Not on file   Highest education level: Not on file  Occupational History   Not on file  Tobacco Use   Smoking status: Never   Smokeless tobacco: Never  Vaping Use   Vaping status: Never Used  Substance and Sexual Activity   Alcohol use: Yes    Comment: ocassional   Drug use: Never   Sexual activity: Not on file  Other Topics Concern   Not on file  Social History Narrative   Not on file   Social Drivers of Health   Financial Resource Strain: Not on file  Food Insecurity: Not on file  Transportation Needs: Not on file  Physical Activity: Not on file  Stress: Not on file  Social Connections: Unknown (10/19/2021)   [facility], [facility]  Social Network    Social Network: Not on file  Intimate Partner Violence: Unknown (09/10/2021)   Received from [facility], [facility]   HITS    Physically Hurt: Not on file    Insult or Talk Down To: Not on file    Threaten Physical Harm: Not on file    Scream or Curse: Not on file      Review of Systems  Constitutional:  Negative for activity change, appetite change, chills, fatigue, fever and unexpected weight change.  HENT: Negative.  Negative for congestion, ear pain, rhinorrhea, sore throat and trouble swallowing.   Eyes: Negative.   Respiratory: Negative.  Negative for cough, chest tightness, shortness of breath and wheezing.    Cardiovascular: Negative.  Negative for chest pain and palpitations.  Gastrointestinal: Negative.  Negative for abdominal pain, blood in stool, constipation, diarrhea, nausea and vomiting.  Endocrine: Negative.   Genitourinary: Negative.  Negative for difficulty urinating, dysuria, frequency, hematuria and urgency.  Musculoskeletal: Negative.  Negative for arthralgias, back pain, joint swelling, myalgias and neck pain.  Skin: Negative.  Negative for rash and wound.  Allergic/Immunologic: Negative.  Negative for immunocompromised state.  Neurological: Negative.  Negative for dizziness, seizures, numbness and headaches.  Hematological: Negative.   Psychiatric/Behavioral: Negative.  Negative for behavioral problems, self-injury and suicidal ideas. The patient is not nervous/anxious.     Vital Signs: BP 138/80   Pulse 63   Temp 98.4 F (36.9 C)   Resp 16   Ht 5\' 11"  (1.803 m)   Wt 265 lb 3.2 oz (120.3 kg)   SpO2 96%   BMI 36.99 kg/m    Physical Exam Vitals reviewed.  Constitutional:      General: She is awake. She is not in acute distress.    Appearance: Normal appearance. She is well-developed and well-groomed. She is obese. She is not ill-appearing or diaphoretic.  HENT:     Head: Normocephalic and atraumatic.     Right Ear: Tympanic membrane, ear canal and external ear normal.     Left Ear: Tympanic membrane, ear canal and external ear normal.     Nose: Nose normal. No congestion or rhinorrhea.     Mouth/Throat:     Lips: Pink.     Pharynx: Oropharynx is clear. Uvula midline. No oropharyngeal exudate or posterior oropharyngeal erythema.  Eyes:     General: Lids are normal. Vision grossly intact. Gaze aligned appropriately. No scleral icterus.       Right eye: No discharge.        Left eye: No discharge.     Extraocular Movements: Extraocular movements intact.     Conjunctiva/sclera: Conjunctivae normal.     Pupils: Pupils are equal, round, and reactive to light.  Neck:      Thyroid: No thyromegaly.     Vascular: No JVD.     Trachea: Trachea and phonation normal. No tracheal deviation.  Cardiovascular:     Rate and Rhythm: Normal rate and regular rhythm.     Pulses:          Dorsalis pedis pulses are 2+ on the right side and 2+ on the left side.       Posterior tibial pulses are 1+ on the right side and 1+ on the left side.     Heart sounds: Normal heart sounds, S1 normal and S2 normal. No murmur heard.    No friction rub. No gallop.  Pulmonary:     Effort: Pulmonary effort is normal. No accessory  muscle usage or respiratory distress.     Breath sounds: Normal breath sounds and air entry. No stridor. No wheezing or rales.  Chest:     Chest wall: No tenderness.  Breasts:    Breasts are symmetrical.     Right: Normal.     Left: Normal.  Abdominal:     General: Bowel sounds are normal. There is no distension.     Palpations: Abdomen is soft. There is no mass.     Tenderness: There is no abdominal tenderness. There is no guarding or rebound.  Musculoskeletal:        General: No tenderness or deformity. Normal range of motion.     Cervical back: Normal range of motion and neck supple.     Right lower leg: No edema.     Left lower leg: No edema.     Right foot: Normal range of motion. No deformity, bunion, Charcot foot, foot drop or prominent metatarsal heads.     Left foot: Normal range of motion. No deformity, bunion, Charcot foot, foot drop or prominent metatarsal heads.  Feet:     Right foot:     Protective Sensation: 6 sites tested.  6 sites sensed.     Skin integrity: Callus and dry skin present. No ulcer, blister, skin breakdown, erythema, warmth or fissure.     Toenail Condition: Right toenails are abnormally thick.     Left foot:     Protective Sensation: 6 sites tested.  6 sites sensed.     Skin integrity: Callus and dry skin present. No ulcer, blister, skin breakdown, erythema, warmth or fissure.     Toenail Condition: Left toenails are  abnormally thick.  Lymphadenopathy:     Cervical: No cervical adenopathy.  Skin:    General: Skin is warm and dry.     Capillary Refill: Capillary refill takes less than 2 seconds.     Coloration: Skin is not pale.     Findings: No erythema or rash.  Neurological:     Mental Status: She is alert and oriented to person, place, and time.     Cranial Nerves: No cranial nerve deficit.     Motor: No abnormal muscle tone.     Coordination: Coordination normal.     Deep Tendon Reflexes: Reflexes are normal and symmetric.  Psychiatric:        Mood and Affect: Mood normal.        Behavior: Behavior normal. Behavior is cooperative.        Thought Content: Thought content normal.        Judgment: Judgment normal.        Assessment/Plan: 1. Encounter for routine adult health examination with abnormal findings (Primary) Age-appropriate preventive screenings and vaccinations discussed, annual physical exam completed. Routine labs for health maintenance results reviewed with the patient. Marland Kitchen PHM updated.    2. Type 2 diabetes mellitus with other specified complication, without long-term current use of insulin (HCC) Start farxiga 10 mg daily as prescribed. Continue ozempic as prescribed   3. Hyperlipidemia associated with type 2 diabetes mellitus (HCC) Will discuss starting statin therapy at next office visit. Patient wants to work on diet first.   4. NAFLD (nonalcoholic fatty liver disease) Continue ozempic as prescribed.   5. Primary generalized (osteo)arthritis Continue meloxicam as prescribed.      General Counseling: Tali verbalizes understanding of the findings of todays visit and agrees with plan of treatment. I have discussed any further diagnostic evaluation that may be  needed or ordered today. We also reviewed her medications today. she has been encouraged to call the office with any questions or concerns that should arise related to todays visit.    No orders of the  defined types were placed in this encounter.   Meds ordered this encounter  Medications   DISCONTD: dapagliflozin propanediol (FARXIGA) 10 MG TABS tablet    Sig: Take 1 tablet (10 mg total) by mouth daily.    Dispense:  30 tablet    Refill:  4    Fill new script asap, send prior auth to fax #[phone]    Return in about 5 weeks (around 05/09/2023) for F/U, eval new med, Cevin Rubinstein PCP.   Total time spent:30 Minutes Time spent includes review of chart, medications, test results, and follow up plan with the patient.   [place] Controlled Substance Database was reviewed by me.  This patient was seen by Sallyanne Kuster, FNP-C in collaboration with Dr. Beverely Risen as a part of collaborative care agreement.  Gohan Collister R. Tedd Sias, MSN, FNP-C Internal medicine

## 2023-04-06 ENCOUNTER — Telehealth: Payer: Self-pay

## 2023-04-06 NOTE — Telephone Encounter (Signed)
Left message for patient to give office a call back

## 2023-04-06 NOTE — Telephone Encounter (Signed)
PA was done for Ozempic and Dapagliflozin Propanediol  was approved and patient was notified.

## 2023-04-07 ENCOUNTER — Inpatient Hospital Stay
Admission: RE | Admit: 2023-04-07 | Discharge: 2023-04-07 | Disposition: A | Discharge status: Home / Self Care | Payer: Self-pay | Source: Ambulatory Visit | Attending: Nurse Practitioner | Admitting: Nurse Practitioner

## 2023-04-07 ENCOUNTER — Other Ambulatory Visit: Payer: Self-pay | Admitting: *Deleted

## 2023-04-07 DIAGNOSIS — Z1231 Encounter for screening mammogram for malignant neoplasm of breast: Secondary | ICD-10-CM

## 2023-05-09 ENCOUNTER — Ambulatory Visit: Payer: Medicaid Other | Admitting: Nurse Practitioner

## 2023-05-09 ENCOUNTER — Encounter: Payer: Self-pay | Admitting: Nurse Practitioner

## 2023-05-09 VITALS — BP 125/75 | HR 60 | Temp 98.3°F | Resp 16 | Ht 71.0 in | Wt 260.8 lb

## 2023-05-09 DIAGNOSIS — E66812 Obesity, class 2: Secondary | ICD-10-CM | POA: Diagnosis not present

## 2023-05-09 DIAGNOSIS — Z6836 Body mass index (BMI) 36.0-36.9, adult: Secondary | ICD-10-CM

## 2023-05-09 DIAGNOSIS — E785 Hyperlipidemia, unspecified: Secondary | ICD-10-CM | POA: Diagnosis not present

## 2023-05-09 DIAGNOSIS — E1169 Type 2 diabetes mellitus with other specified complication: Secondary | ICD-10-CM

## 2023-05-09 DIAGNOSIS — B3731 Acute candidiasis of vulva and vagina: Secondary | ICD-10-CM | POA: Diagnosis not present

## 2023-05-09 MED ORDER — FLUCONAZOLE 150 MG PO TABS: 150.0000 mg | ORAL_TABLET | Freq: Once | ORAL | 4 refills | Status: AC

## 2023-05-09 MED ORDER — DAPAGLIFLOZIN PROPANEDIOL 10 MG PO TABS
10.0000 mg | ORAL_TABLET | Freq: Every day | ORAL | 3 refills | Status: DC
Start: 2023-05-09 — End: 2024-04-20

## 2023-05-09 NOTE — Progress Notes (Unsigned)
[facility] [address] [place], [place] [address]  Internal MEDICINE  Office Visit Note  Patient Name: Kara Perez  [id number]  [id number]  Date of Service: 05/09/2023  Chief Complaint  Patient presents with  . Diabetes  . Follow-up    Eval new med.     HPI Scarlett presents for a follow-up visit for diabetes, weight loss and yeast infection.  Diabetes -- taking farxiga, tolerating medication except for yeast infection. No other side effects.  Current vaginal yeast infection.  Lost 5 lbs and is drinking more water.     Current Medication: Outpatient Encounter Medications as of 05/09/2023  Medication Sig  . Accu-Chek Softclix Lancets lancets Use a directed twice a day E11.65  . B Complex Vitamins (VITAMIN B COMPLEX PO) Take by mouth. Liquid OTC  . Calcium Carb-Cholecalciferol 600-10 MG-MCG TABS Take 1 tablet by mouth daily.  . diclofenac Sodium (VOLTAREN) 1 % GEL Apply 4 g topically 4 (four) times daily.  . fluconazole (DIFLUCAN) 150 MG tablet Take 1 tablet (150 mg total) by mouth once for 1 dose. May take an additional dose after 3 days if still symptomatic.  . fluticasone (FLONASE) 50 MCG/ACT nasal spray USE 1 SPRAY(S) IN EACH NOSTRIL ONCE DAILY  . lidocaine (LIDODERM) 5 % Place 1 patch onto the skin daily.  Marland Kitchen loratadine (CLARITIN) 10 MG tablet Take 1 tablet (10 mg total) by mouth daily.  . meloxicam (MOBIC) 15 MG tablet Take 1 tablet (15 mg total) by mouth daily. In am with breakfast  . Semaglutide, 2 MG/DOSE, 8 MG/3ML SOPN Inject 2 mg as directed once a week.  . Vitamin D, Ergocalciferol, (DRISDOL) 1.25 MG (50000 UNIT) CAPS capsule Take 1 capsule (50,000 Units total) by mouth every 7 (seven) days.  . [DISCONTINUED] dapagliflozin propanediol (FARXIGA) 10 MG TABS tablet Take 1 tablet (10 mg total) by mouth daily.  . dapagliflozin propanediol (FARXIGA) 10 MG TABS tablet Take 1 tablet (10 mg total) by mouth daily.   No facility-administered encounter  medications on file as of 05/09/2023.    Surgical History: Past Surgical History:  Procedure Laterality Date  . ABDOMINAL HYSTERECTOMY    . CESAREAN SECTION    . lacerated liver      Medical History: Past Medical History:  Diagnosis Date  . Diabetes mellitus without complication (HCC)   . Prediabetes     Family History: Family History  Problem Relation Age of Onset  . Diabetes Mother   . Hypertension Father   . Liver disease Father   . Diabetes Sister   . Breast cancer Paternal Grandmother 33    Social History   Socioeconomic History  . Marital status: Single    Spouse name: Not on file  . Number of children: Not on file  . Years of education: Not on file  . Highest education level: Not on file  Occupational History  . Not on file  Tobacco Use  . Smoking status: Never  . Smokeless tobacco: Never  Vaping Use  . Vaping status: Never Used  Substance and Sexual Activity  . Alcohol use: Yes    Comment: ocassional  . Drug use: Never  . Sexual activity: Not on file  Other Topics Concern  . Not on file  Social History Narrative  . Not on file   Social Determinants of Health   Financial Resource Strain: Not on file  Food Insecurity: Not on file  Transportation Needs: Not on file  Physical Activity: Not on file  Stress: Not on file  Social Connections: Unknown (10/19/2021)   [facility] from [facility], [facility]   Social Network   . Social Network: Not on file  Intimate Partner Violence: Unknown (09/10/2021)   [facility] from [facility], [facility]   HITS   . Physically Hurt: Not on file   . Insult or Talk Down To: Not on file   . Threaten Physical Harm: Not on file   . Scream or Curse: Not on file      Review of Systems  Vital Signs: BP 125/75 Comment: 130/99  Pulse 60   Temp 98.3 F (36.8 C)   Resp 16   Ht 5\' 11"  (1.803 m)   Wt 260 lb 12.8 oz (118.3 kg)   SpO2 98%   BMI 36.37 kg/m    Physical  Exam     Assessment/Plan:   General Counseling: Vivia verbalizes understanding of the findings of todays visit and agrees with plan of treatment. I have discussed any further diagnostic evaluation that may be needed or ordered today. We also reviewed her medications today. she has been encouraged to call the office with any questions or concerns that should arise related to todays visit.    Orders Placed This Encounter  Procedures  . Urine Microalbumin w/creat. ratio    Meds ordered this encounter  Medications  . fluconazole (DIFLUCAN) 150 MG tablet    Sig: Take 1 tablet (150 mg total) by mouth once for 1 dose. May take an additional dose after 3 days if still symptomatic.    Dispense:  3 tablet    Refill:  4  . dapagliflozin propanediol (FARXIGA) 10 MG TABS tablet    Sig: Take 1 tablet (10 mg total) by mouth daily.    Dispense:  90 tablet    Refill:  3    Please fill as a 90 day supply.    Return in about 4 months (around 09/07/2023) for F/U, Recheck A1C, Charle Clear PCP, testing.   Total time spent:30 Minutes Time spent includes review of chart, medications, test results, and follow up plan with the patient.   [place] Controlled Substance Database was reviewed by me.  This patient was seen by Sallyanne Kuster, FNP-C in collaboration with Dr. Beverely Risen as a part of collaborative care agreement.   Cricket Goodlin R. Tedd Sias, MSN, FNP-C Internal medicine

## 2023-05-10 ENCOUNTER — Encounter: Payer: Self-pay | Admitting: Nurse Practitioner

## 2023-05-10 DIAGNOSIS — E1169 Type 2 diabetes mellitus with other specified complication: Secondary | ICD-10-CM | POA: Insufficient documentation

## 2023-05-10 LAB — MICROALBUMIN / CREATININE URINE RATIO
Creatinine, Urine: 214.6 mg/dL
Microalb/Creat Ratio: 114 mg/g{creat} — ABNORMAL HIGH (ref 0–29)
Microalbumin, Urine: 245.2 ug/mL

## 2023-05-22 ENCOUNTER — Encounter: Payer: Self-pay | Admitting: Nurse Practitioner

## 2023-05-26 NOTE — Progress Notes (Signed)
Slightly increased microalbuminuria which is protein in the urine. Kidney function labs are normal. Will continue to monitor periodically. No interventions needed at this time.

## 2023-06-28 ENCOUNTER — Other Ambulatory Visit: Payer: Self-pay | Admitting: Nurse Practitioner

## 2023-06-28 DIAGNOSIS — M150 Primary generalized (osteo)arthritis: Secondary | ICD-10-CM

## 2023-09-09 ENCOUNTER — Ambulatory Visit: Payer: Medicaid Other | Admitting: Nurse Practitioner

## 2023-09-16 ENCOUNTER — Ambulatory Visit: Payer: Medicaid Other | Admitting: Nurse Practitioner

## 2023-09-16 ENCOUNTER — Other Ambulatory Visit
Admission: RE | Admit: 2023-09-16 | Discharge: 2023-09-16 | Disposition: A | Discharge status: Home / Self Care | Source: Ambulatory Visit | Attending: Nurse Practitioner | Admitting: Nurse Practitioner

## 2023-09-16 ENCOUNTER — Encounter: Payer: Self-pay | Admitting: Nurse Practitioner

## 2023-09-16 VITALS — BP 130/78 | HR 60 | Temp 98.1°F | Resp 16 | Ht 71.0 in | Wt 261.0 lb

## 2023-09-16 DIAGNOSIS — E66812 Obesity, class 2: Secondary | ICD-10-CM

## 2023-09-16 DIAGNOSIS — Z113 Encounter for screening for infections with a predominantly sexual mode of transmission: Secondary | ICD-10-CM | POA: Diagnosis not present

## 2023-09-16 DIAGNOSIS — R300 Dysuria: Secondary | ICD-10-CM

## 2023-09-16 DIAGNOSIS — E1169 Type 2 diabetes mellitus with other specified complication: Secondary | ICD-10-CM

## 2023-09-16 DIAGNOSIS — E785 Hyperlipidemia, unspecified: Secondary | ICD-10-CM

## 2023-09-16 DIAGNOSIS — Z79899 Other long term (current) drug therapy: Secondary | ICD-10-CM

## 2023-09-16 DIAGNOSIS — Z6836 Body mass index (BMI) 36.0-36.9, adult: Secondary | ICD-10-CM

## 2023-09-16 DIAGNOSIS — Z119 Encounter for screening for infectious and parasitic diseases, unspecified: Secondary | ICD-10-CM

## 2023-09-16 LAB — POCT URINALYSIS DIPSTICK
Bilirubin, UA: NEGATIVE
Glucose, UA: POSITIVE — AB
Ketones, UA: NEGATIVE
Leukocytes, UA: NEGATIVE
Nitrite, UA: NEGATIVE
Protein, UA: NEGATIVE
Spec Grav, UA: 1.015 (ref 1.010–1.025)
Urobilinogen, UA: 0.2 U/dL
pH, UA: 5 (ref 5.0–8.0)

## 2023-09-16 LAB — POCT GLYCOSYLATED HEMOGLOBIN (HGB A1C): Hemoglobin A1C: 5.6 % (ref 4.0–5.6)

## 2023-09-16 MED ORDER — LIDOCAINE 5 % EX PTCH: 1.0000 | MEDICATED_PATCH | CUTANEOUS | 3 refills | Status: DC

## 2023-09-16 MED ORDER — VITAMIN D (ERGOCALCIFEROL) 1.25 MG (50000 UNIT) PO CAPS: 50000.0000 [IU] | ORAL_CAPSULE | ORAL | 1 refills | Status: DC

## 2023-09-16 MED ORDER — SEMAGLUTIDE (2 MG/DOSE) 8 MG/3ML ~~LOC~~ SOPN
2.0000 mg | PEN_INJECTOR | SUBCUTANEOUS | 5 refills | Status: DC
Start: 2023-09-16 — End: 2024-01-06

## 2023-09-16 MED ORDER — LORATADINE 10 MG PO TABS: 10.0000 mg | ORAL_TABLET | Freq: Every day | ORAL | 3 refills | Status: DC

## 2023-09-16 MED ORDER — DICLOFENAC SODIUM 1 % EX GEL: 4.0000 g | Freq: Four times a day (QID) | CUTANEOUS | 3 refills | Status: DC

## 2023-09-16 MED ORDER — FLUTICASONE PROPIONATE 50 MCG/ACT NA SUSP: NASAL | 5 refills | Status: DC

## 2023-09-16 MED ORDER — MELOXICAM 15 MG PO TABS: 15.0000 mg | ORAL_TABLET | Freq: Every day | ORAL | 1 refills | Status: DC

## 2023-09-16 NOTE — Progress Notes (Signed)
 [facility] [address] [place], [place] [address]  Internal MEDICINE  Office Visit Note  Patient Name: Kara Perez  [id number]  [id number]  Date of Service: 09/16/2023  Chief Complaint  Patient presents with   Diabetes   Follow-up    HPI Kara Perez presents for a follow-up visit for diabetes, weight loss, high cholesterol, and STD labs.  Diabetes -- A1c is 5.6, significantly improve from September and now in normal range.  High cholesterol -- not currently taking any medication for cholesterol, has been working on diet and physical activity.  Weight loss -- no significant weight gain or weight loss. Has been more active and is eating a more healthy balanced diet.  Requesting labs for STDs.   Wants to be checked for UTI, no symptoms. Urine showed trace blood but no nitrites or leukocytes. Will send for culture.     Current Medication: Outpatient Encounter Medications as of 09/16/2023  Medication Sig   Accu-Chek Softclix Lancets lancets Use a directed twice a day E11.65   B Complex Vitamins (VITAMIN B COMPLEX PO) Take by mouth. Liquid OTC   Calcium Carb-Cholecalciferol 600-10 MG-MCG TABS Take 1 tablet by mouth daily.   dapagliflozin propanediol (FARXIGA) 10 MG TABS tablet Take 1 tablet (10 mg total) by mouth daily.   diclofenac Sodium (VOLTAREN) 1 % GEL Apply 4 g topically 4 (four) times daily.   fluticasone (FLONASE) 50 MCG/ACT nasal spray USE 1 SPRAY(S) IN EACH NOSTRIL ONCE DAILY   lidocaine (LIDODERM) 5 % Place 1 patch onto the skin daily.   loratadine (CLARITIN) 10 MG tablet Take 1 tablet (10 mg total) by mouth daily.   meloxicam (MOBIC) 15 MG tablet Take 1 tablet (15 mg total) by mouth daily.   Semaglutide, 2 MG/DOSE, 8 MG/3ML SOPN Inject 2 mg as directed once a week.   Vitamin D, Ergocalciferol, (DRISDOL) 1.25 MG (50000 UNIT) CAPS capsule Take 1 capsule (50,000 Units total) by mouth every 7 (seven) days.   [DISCONTINUED] diclofenac Sodium (VOLTAREN) 1 % GEL  Apply 4 g topically 4 (four) times daily.   [DISCONTINUED] fluticasone (FLONASE) 50 MCG/ACT nasal spray USE 1 SPRAY(S) IN EACH NOSTRIL ONCE DAILY   [DISCONTINUED] lidocaine (LIDODERM) 5 % Place 1 patch onto the skin daily.   [DISCONTINUED] loratadine (CLARITIN) 10 MG tablet Take 1 tablet (10 mg total) by mouth daily.   [DISCONTINUED] meloxicam (MOBIC) 15 MG tablet TAKE 1 TABLET BY MOUTH IN THE MORNING WITH BREAKFAST   [DISCONTINUED] Semaglutide, 2 MG/DOSE, 8 MG/3ML SOPN Inject 2 mg as directed once a week.   [DISCONTINUED] Vitamin D, Ergocalciferol, (DRISDOL) 1.25 MG (50000 UNIT) CAPS capsule Take 1 capsule (50,000 Units total) by mouth every 7 (seven) days.   No facility-administered encounter medications on file as of 09/16/2023.    Surgical History: Past Surgical History:  Procedure Laterality Date   ABDOMINAL HYSTERECTOMY     CESAREAN SECTION     lacerated liver      Medical History: Past Medical History:  Diagnosis Date   Diabetes mellitus without complication (HCC)    Prediabetes     Family History: Family History  Problem Relation Age of Onset   Diabetes Mother    Hypertension Father    Liver disease Father    Diabetes Sister    Breast cancer Paternal Grandmother 52    Social History   Socioeconomic History   Marital status: Single    Spouse name: Not on file   Number of children: Not on file  Years of education: Not on file   Highest education level: Not on file  Occupational History   Not on file  Tobacco Use   Smoking status: Never   Smokeless tobacco: Never  Vaping Use   Vaping status: Never Used  Substance and Sexual Activity   Alcohol use: Yes    Comment: ocassional   Drug use: Never   Sexual activity: Not on file  Other Topics Concern   Not on file  Social History Narrative   Not on file   Social Drivers of Health   Financial Resource Strain: Not on file  Food Insecurity: Not on file  Transportation Needs: Not on file  Physical Activity:  Not on file  Stress: Not on file  Social Connections: Unknown (10/19/2021)   Received from [facility]   Social Network    Social Network: Not on file  Intimate Partner Violence: Unknown (09/10/2021)   Received from [facility], [facility]   HITS    Physically Hurt: Not on file    Insult or Talk Down To: Not on file    Threaten Physical Harm: Not on file    Scream or Curse: Not on file      Review of Systems  Constitutional:  Positive for fatigue. Negative for appetite change, chills and unexpected weight change.  HENT:  Negative for congestion, rhinorrhea, sneezing and sore throat.   Eyes:  Negative for redness.  Respiratory: Negative.  Negative for cough, chest tightness, shortness of breath and wheezing.   Cardiovascular: Negative.  Negative for chest pain and palpitations.  Gastrointestinal:  Negative for abdominal pain, constipation, diarrhea, nausea and vomiting.  Genitourinary:  Negative for dysuria and frequency.  Musculoskeletal:  Negative for arthralgias, back pain, joint swelling and neck pain.  Skin:  Negative for rash.  Neurological: Negative.  Negative for tremors and numbness.  Hematological:  Negative for adenopathy. Does not bruise/bleed easily.  Psychiatric/Behavioral:  Positive for behavioral problems (Depression) and sleep disturbance. Negative for dysphoric mood, self-injury and suicidal ideas. The patient is nervous/anxious.     Vital Signs: BP 130/78   Pulse 60   Temp 98.1 F (36.7 C)   Resp 16   Ht 5\' 11"  (1.803 m)   Wt 261 lb (118.4 kg)   SpO2 94%   BMI 36.40 kg/m    Physical Exam Vitals reviewed.  Constitutional:      General: She is not in acute distress.    Appearance: Normal appearance. She is obese. She is not ill-appearing.  HENT:     Head: Normocephalic and atraumatic.  Eyes:     Pupils: Pupils are equal, round, and reactive to light.  Cardiovascular:     Rate and Rhythm: Normal rate and regular rhythm.   Pulmonary:     Effort: Pulmonary effort is normal. No respiratory distress.  Neurological:     Mental Status: She is alert and oriented to person, place, and time.  Psychiatric:        Mood and Affect: Mood is depressed. Mood is not anxious. Affect is not tearful.        Behavior: Behavior normal. Behavior is cooperative.        Assessment/Plan: 1. Type 2 diabetes mellitus with other specified complication, without long-term current use of insulin (HCC) (Primary) A1c is stable and in normal range. Continue ozempic 2 mg weekly as prescribed.  - POCT glycosylated hemoglobin (Hb A1C) - Semaglutide, 2 MG/DOSE, 8 MG/3ML SOPN; Inject 2 mg as directed once  a week.  Dispense: 3 mL; Refill: 5  2. Hyperlipidemia associated with type 2 diabetes mellitus (HCC) Continue diet and lifestyle modifications as discussed.   3. Class 2 severe obesity due to excess calories with serious comorbidity and body mass index (BMI) of 36.0 to 36.9 in adult (HCC) Continue ozempic 2 mg weekly.   4. Dysuria Urinalysis shows trace blood, sent for culture. No symptoms.  - POCT Urinalysis Dipstick - CULTURE, URINE COMPREHENSIVE  5. Screening for STDs (sexually transmitted diseases) Labs ordered for screening, urine sent  - STI Profile - Chlamydia/Gonococcus/Trichomonas, NAA  6. Encounter for screening examination for infectious disease Lab ordered to check for HIV, hep B, hep C and syphilis.  - STI Profile  7. Encounter for medication review Medication list reviewed, updated and refills ordered.  - fluticasone (FLONASE) 50 MCG/ACT nasal spray; USE 1 SPRAY(S) IN EACH NOSTRIL ONCE DAILY  Dispense: 16 g; Refill: 5 - lidocaine (LIDODERM) 5 %; Place 1 patch onto the skin daily.  Dispense: 30 patch; Refill: 3 - loratadine (CLARITIN) 10 MG tablet; Take 1 tablet (10 mg total) by mouth daily.  Dispense: 30 tablet; Refill: 3 - meloxicam (MOBIC) 15 MG tablet; Take 1 tablet (15 mg total) by mouth daily.  Dispense: 90  tablet; Refill: 1 - Vitamin D, Ergocalciferol, (DRISDOL) 1.25 MG (50000 UNIT) CAPS capsule; Take 1 capsule (50,000 Units total) by mouth every 7 (seven) days.  Dispense: 12 capsule; Refill: 1 - diclofenac Sodium (VOLTAREN) 1 % GEL; Apply 4 g topically 4 (four) times daily.  Dispense: 100 g; Refill: 3   General Counseling: Lorine verbalizes understanding of the findings of todays visit and agrees with plan of treatment. I have discussed any further diagnostic evaluation that may be needed or ordered today. We also reviewed her medications today. she has been encouraged to call the office with any questions or concerns that should arise related to todays visit.    Orders Placed This Encounter  Procedures   Chlamydia/Gonococcus/Trichomonas, NAA   STI Profile   POCT glycosylated hemoglobin (Hb A1C)   POCT Urinalysis Dipstick    Meds ordered this encounter  Medications   fluticasone (FLONASE) 50 MCG/ACT nasal spray    Sig: USE 1 SPRAY(S) IN EACH NOSTRIL ONCE DAILY    Dispense:  16 g    Refill:  5   lidocaine (LIDODERM) 5 %    Sig: Place 1 patch onto the skin daily.    Dispense:  30 patch    Refill:  3   loratadine (CLARITIN) 10 MG tablet    Sig: Take 1 tablet (10 mg total) by mouth daily.    Dispense:  30 tablet    Refill:  3   meloxicam (MOBIC) 15 MG tablet    Sig: Take 1 tablet (15 mg total) by mouth daily.    Dispense:  90 tablet    Refill:  1   Semaglutide, 2 MG/DOSE, 8 MG/3ML SOPN    Sig: Inject 2 mg as directed once a week.    Dispense:  3 mL    Refill:  5    Please discontinue 1 mg dose and fill ozempic 2 mg dose ASAP thank today. Dx code E11.65   Vitamin D, Ergocalciferol, (DRISDOL) 1.25 MG (50000 UNIT) CAPS capsule    Sig: Take 1 capsule (50,000 Units total) by mouth every 7 (seven) days.    Dispense:  12 capsule    Refill:  1   diclofenac Sodium (VOLTAREN) 1 % GEL  Sig: Apply 4 g topically 4 (four) times daily.    Dispense:  100 g    Refill:  3    Return for  previously scheduled, CPE, Joli Koob PCP in november .   Total time spent:30 Minutes Time spent includes review of chart, medications, test results, and follow up plan with the patient.   [place] Controlled Substance Database was reviewed by me.  This patient was seen by Sallyanne Kuster, FNP-C in collaboration with Dr. Beverely Risen as a part of collaborative care agreement.   Kathy Wahid R. Tedd Sias, MSN, FNP-C Internal medicine

## 2023-09-17 LAB — MISC LABCORP TEST (SEND OUT): Labcorp test code: 144011

## 2023-09-19 LAB — CHLAMYDIA/GONOCOCCUS/TRICHOMONAS, NAA
Chlamydia by NAA: NEGATIVE
Gonococcus by NAA: NEGATIVE
Trich vag by NAA: NEGATIVE

## 2023-09-20 LAB — CULTURE, URINE COMPREHENSIVE

## 2023-10-10 ENCOUNTER — Telehealth: Payer: Self-pay

## 2023-10-12 NOTE — Telephone Encounter (Signed)
 Patient notified

## 2024-01-06 ENCOUNTER — Encounter: Payer: Self-pay | Admitting: Nurse Practitioner

## 2024-01-06 ENCOUNTER — Ambulatory Visit (INDEPENDENT_AMBULATORY_CARE_PROVIDER_SITE_OTHER): Admitting: Nurse Practitioner

## 2024-01-06 VITALS — BP 120/65 | HR 60 | Temp 97.3°F | Resp 16 | Ht 71.0 in | Wt 257.0 lb

## 2024-01-06 DIAGNOSIS — Z6836 Body mass index (BMI) 36.0-36.9, adult: Secondary | ICD-10-CM

## 2024-01-06 DIAGNOSIS — R300 Dysuria: Secondary | ICD-10-CM | POA: Diagnosis not present

## 2024-01-06 DIAGNOSIS — E1169 Type 2 diabetes mellitus with other specified complication: Secondary | ICD-10-CM | POA: Diagnosis not present

## 2024-01-06 DIAGNOSIS — M150 Primary generalized (osteo)arthritis: Secondary | ICD-10-CM | POA: Diagnosis not present

## 2024-01-06 DIAGNOSIS — Z113 Encounter for screening for infections with a predominantly sexual mode of transmission: Secondary | ICD-10-CM

## 2024-01-06 DIAGNOSIS — E559 Vitamin D deficiency, unspecified: Secondary | ICD-10-CM | POA: Diagnosis not present

## 2024-01-06 DIAGNOSIS — E66812 Obesity, class 2: Secondary | ICD-10-CM | POA: Diagnosis not present

## 2024-01-06 DIAGNOSIS — Z79899 Other long term (current) drug therapy: Secondary | ICD-10-CM

## 2024-01-06 LAB — POCT URINALYSIS DIPSTICK
Bilirubin, UA: NEGATIVE
Glucose, UA: POSITIVE — AB
Leukocytes, UA: NEGATIVE
Nitrite, UA: NEGATIVE
Protein, UA: NEGATIVE
Spec Grav, UA: 1.015 (ref 1.010–1.025)
Urobilinogen, UA: 0.2 U/dL
pH, UA: 5 (ref 5.0–8.0)

## 2024-01-06 LAB — POCT GLYCOSYLATED HEMOGLOBIN (HGB A1C): Hemoglobin A1C: 5.8 % — AB (ref 4.0–5.6)

## 2024-01-06 MED ORDER — LIDOCAINE 5 % EX PTCH: 1.0000 | MEDICATED_PATCH | CUTANEOUS | 3 refills | Status: DC

## 2024-01-06 MED ORDER — MELOXICAM 15 MG PO TABS
15.0000 mg | ORAL_TABLET | Freq: Every day | ORAL | 1 refills | Status: DC
Start: 2024-01-06 — End: 2024-04-20

## 2024-01-06 MED ORDER — LORATADINE 10 MG PO TABS
10.0000 mg | ORAL_TABLET | Freq: Every day | ORAL | 3 refills | Status: DC
Start: 2024-01-06 — End: 2024-04-20

## 2024-01-06 MED ORDER — SEMAGLUTIDE (2 MG/DOSE) 8 MG/3ML ~~LOC~~ SOPN: 2.0000 mg | PEN_INJECTOR | SUBCUTANEOUS | 5 refills | Status: DC

## 2024-01-06 MED ORDER — DICLOFENAC SODIUM 1 % EX GEL: 4.0000 g | Freq: Four times a day (QID) | CUTANEOUS | 3 refills | Status: DC

## 2024-01-06 MED ORDER — FLUTICASONE PROPIONATE 50 MCG/ACT NA SUSP
NASAL | 5 refills | Status: DC
Start: 2024-01-06 — End: 2024-04-20

## 2024-01-06 MED ORDER — VITAMIN D (ERGOCALCIFEROL) 1.25 MG (50000 UNIT) PO CAPS: 50000.0000 [IU] | ORAL_CAPSULE | ORAL | 1 refills | Status: AC

## 2024-01-06 NOTE — Progress Notes (Signed)
 [facility] [address] [place], [place] [address]  Internal MEDICINE  Office Visit Note  Patient Name: Kara Perez  [id number]  [id number]  Date of Service: 01/06/2024  Chief Complaint  Patient presents with   Diabetes   Follow-up    HPI Kara Perez presents for a follow-up visit for dysuria, diabetes and hypertension.  Wants urine checked for STDs and UTI. Denies any symptoms at this time. Urinalysis was positive for glucose, trace ketones and moderate blood. Glucose is expected since the patient takes farxiga . Diabetes -- A1c is slightly increased but still stable at 5.8 Hypertension -- initially elevated, normal when rechecked, controlled with medications.  Patient requesting to be checked for STDs.    Current Medication: Outpatient Encounter Medications as of 01/06/2024  Medication Sig   Accu-Chek Softclix Lancets lancets Use a directed twice a day E11.65   B Complex Vitamins (VITAMIN B COMPLEX PO) Take by mouth. Liquid OTC   Calcium Carb-Cholecalciferol 600-10 MG-MCG TABS Take 1 tablet by mouth daily.   dapagliflozin  propanediol (FARXIGA ) 10 MG TABS tablet Take 1 tablet (10 mg total) by mouth daily.   diclofenac  Sodium (VOLTAREN ) 1 % GEL Apply 4 g topically 4 (four) times daily.   fluticasone  (FLONASE ) 50 MCG/ACT nasal spray USE 1 SPRAY(S) IN EACH NOSTRIL ONCE DAILY   lidocaine  (LIDODERM ) 5 % Place 1 patch onto the skin daily.   loratadine  (CLARITIN ) 10 MG tablet Take 1 tablet (10 mg total) by mouth daily.   meloxicam  (MOBIC ) 15 MG tablet Take 1 tablet (15 mg total) by mouth daily.   Semaglutide , 2 MG/DOSE, 8 MG/3ML SOPN Inject 2 mg as directed once a week.   Vitamin D , Ergocalciferol , (DRISDOL ) 1.25 MG (50000 UNIT) CAPS capsule Take 1 capsule (50,000 Units total) by mouth every 7 (seven) days.   [DISCONTINUED] diclofenac  Sodium (VOLTAREN ) 1 % GEL Apply 4 g topically 4 (four) times daily.   [DISCONTINUED] fluticasone  (FLONASE ) 50 MCG/ACT nasal spray USE 1  SPRAY(S) IN EACH NOSTRIL ONCE DAILY   [DISCONTINUED] lidocaine  (LIDODERM ) 5 % Place 1 patch onto the skin daily.   [DISCONTINUED] loratadine  (CLARITIN ) 10 MG tablet Take 1 tablet (10 mg total) by mouth daily.   [DISCONTINUED] meloxicam  (MOBIC ) 15 MG tablet Take 1 tablet (15 mg total) by mouth daily.   [DISCONTINUED] Semaglutide , 2 MG/DOSE, 8 MG/3ML SOPN Inject 2 mg as directed once a week.   [DISCONTINUED] Vitamin D , Ergocalciferol , (DRISDOL ) 1.25 MG (50000 UNIT) CAPS capsule Take 1 capsule (50,000 Units total) by mouth every 7 (seven) days.   No facility-administered encounter medications on file as of 01/06/2024.    Surgical History: Past Surgical History:  Procedure Laterality Date   ABDOMINAL HYSTERECTOMY     CESAREAN SECTION     lacerated liver      Medical History: Past Medical History:  Diagnosis Date   Diabetes mellitus without complication (HCC)    Prediabetes     Family History: Family History  Problem Relation Age of Onset   Diabetes Mother    Hypertension Father    Liver disease Father    Diabetes Sister    Breast cancer Paternal Grandmother 61    Social History   Socioeconomic History   Marital status: Single    Spouse name: Not on file   Number of children: Not on file   Years of education: Not on file   Highest education level: Not on file  Occupational History   Not on file  Tobacco Use   Smoking  status: Never   Smokeless tobacco: Never  Vaping Use   Vaping status: Never Used  Substance and Sexual Activity   Alcohol use: Yes    Comment: ocassional   Drug use: Never   Sexual activity: Not on file  Other Topics Concern   Not on file  Social History Narrative   Not on file   Social Drivers of Health   Financial Resource Strain: Not on file  Food Insecurity: Not on file  Transportation Needs: Not on file  Physical Activity: Not on file  Stress: Not on file  Social Connections: Unknown (10/19/2021)   Received from [facility]   Social  Network    Social Network: Not on file  Intimate Partner Violence: Unknown (09/10/2021)   Received from [facility]   HITS    Physically Hurt: Not on file    Insult or Talk Down To: Not on file    Threaten Physical Harm: Not on file    Scream or Curse: Not on file      Review of Systems  Constitutional:  Positive for fatigue. Negative for appetite change, chills and unexpected weight change.  HENT:  Negative for congestion, rhinorrhea, sneezing and sore throat.   Eyes:  Negative for redness.  Respiratory: Negative.  Negative for cough, chest tightness, shortness of breath and wheezing.   Cardiovascular: Negative.  Negative for chest pain and palpitations.  Gastrointestinal:  Negative for abdominal pain, constipation, diarrhea, nausea and vomiting.  Genitourinary:  Negative for dysuria and frequency.  Musculoskeletal:  Negative for arthralgias, back pain, joint swelling and neck pain.  Skin:  Negative for rash.  Neurological: Negative.  Negative for tremors and numbness.  Hematological:  Negative for adenopathy. Does not bruise/bleed easily.  Psychiatric/Behavioral:  Positive for behavioral problems (Depression) and sleep disturbance. Negative for dysphoric mood, self-injury and suicidal ideas. The patient is nervous/anxious.     Vital Signs: BP 120/65 Comment: 140/88  Pulse 60   Temp (!) 97.3 F (36.3 C)   Resp 16   Ht 5' 11 (1.803 m)   Wt 257 lb (116.6 kg)   SpO2 98%   BMI 35.84 kg/m    Physical Exam Vitals reviewed.  Constitutional:      General: She is not in acute distress.    Appearance: Normal appearance. She is obese. She is not ill-appearing.  HENT:     Head: Normocephalic and atraumatic.  Eyes:     Pupils: Pupils are equal, round, and reactive to light.  Cardiovascular:     Rate and Rhythm: Normal rate and regular rhythm.  Pulmonary:     Effort: Pulmonary effort is normal. No respiratory distress.  Neurological:     Mental Status: She is alert and  oriented to person, place, and time.  Psychiatric:        Mood and Affect: Mood is depressed. Mood is not anxious. Affect is not tearful.        Behavior: Behavior normal. Behavior is cooperative.        Assessment/Plan: 1. Type 2 diabetes mellitus with other specified complication, without long-term current use of insulin (HCC) (Primary) A1c is stable at 5.8, continue ozempic  as prescribed.  - POCT glycosylated hemoglobin (Hb A1C) - Semaglutide , 2 MG/DOSE, 8 MG/3ML SOPN; Inject 2 mg as directed once a week.  Dispense: 3 mL; Refill: 5  2. Primary generalized (osteo)arthritis Continue meloxicam , topical voltaren  gel and lidocaine  patches as prescribed. - lidocaine  (LIDODERM ) 5 %; Place 1 patch onto the skin daily.  Dispense: 30 patch; Refill: 3 - diclofenac  Sodium (VOLTAREN ) 1 % GEL; Apply 4 g topically 4 (four) times daily.  Dispense: 100 g; Refill: 3 - meloxicam  (MOBIC ) 15 MG tablet; Take 1 tablet (15 mg total) by mouth daily.  Dispense: 90 tablet; Refill: 1  3. Vitamin D  deficiency Continue weekly vitamin D  supplement as prescribed.  - Vitamin D , Ergocalciferol , (DRISDOL ) 1.25 MG (50000 UNIT) CAPS capsule; Take 1 capsule (50,000 Units total) by mouth every 7 (seven) days.  Dispense: 12 capsule; Refill: 1  4. Class 2 severe obesity due to excess calories with serious comorbidity and body mass index (BMI) of 36.0 to 36.9 in adult [facility]) Lost 4 lbs, continue ozempic  for diabetes   5. Dysuria Urine specimen sent for STD testing and culture. Urinalysis positive for blood and glucose.  - POCT urinalysis dipstick - CULTURE, URINE COMPREHENSIVE - Chlamydia/Gonococcus/Trichomonas, NAA  6. Encounter for medication review Medication list reviewed, updated, and refills ordered  - loratadine  (CLARITIN ) 10 MG tablet; Take 1 tablet (10 mg total) by mouth daily.  Dispense: 30 tablet; Refill: 3 - fluticasone  (FLONASE ) 50 MCG/ACT nasal spray; USE 1 SPRAY(S) IN EACH NOSTRIL ONCE DAILY  Dispense:  16 g; Refill: 5  7. Screen for STD (sexually transmitted disease) Urine specimen sent to lab - Chlamydia/Gonococcus/Trichomonas, NAA   General Counseling: Kara Perez verbalizes understanding of the findings of todays visit and agrees with plan of treatment. I have discussed any further diagnostic evaluation that may be needed or ordered today. We also reviewed her medications today. she has been encouraged to call the office with any questions or concerns that should arise related to todays visit.    Orders Placed This Encounter  Procedures   CULTURE, URINE COMPREHENSIVE   Chlamydia/Gonococcus/Trichomonas, NAA   POCT glycosylated hemoglobin (Hb A1C)   POCT urinalysis dipstick    Meds ordered this encounter  Medications   lidocaine  (LIDODERM ) 5 %    Sig: Place 1 patch onto the skin daily.    Dispense:  30 patch    Refill:  3   diclofenac  Sodium (VOLTAREN ) 1 % GEL    Sig: Apply 4 g topically 4 (four) times daily.    Dispense:  100 g    Refill:  3   meloxicam  (MOBIC ) 15 MG tablet    Sig: Take 1 tablet (15 mg total) by mouth daily.    Dispense:  90 tablet    Refill:  1   Semaglutide , 2 MG/DOSE, 8 MG/3ML SOPN    Sig: Inject 2 mg as directed once a week.    Dispense:  3 mL    Refill:  5    Please discontinue 1 mg dose and fill ozempic  2 mg dose ASAP thank today. Dx code E11.65   loratadine  (CLARITIN ) 10 MG tablet    Sig: Take 1 tablet (10 mg total) by mouth daily.    Dispense:  30 tablet    Refill:  3   fluticasone  (FLONASE ) 50 MCG/ACT nasal spray    Sig: USE 1 SPRAY(S) IN EACH NOSTRIL ONCE DAILY    Dispense:  16 g    Refill:  5   Vitamin D , Ergocalciferol , (DRISDOL ) 1.25 MG (50000 UNIT) CAPS capsule    Sig: Take 1 capsule (50,000 Units total) by mouth every 7 (seven) days.    Dispense:  12 capsule    Refill:  1    Return for previously scheduled, CPE, Shiron Whetsel PCP in november.   Total time spent:30 Minutes Time spent includes review  of chart, medications, test results,  and follow up plan with the patient.   [place] Controlled Substance Database was reviewed by me.  This patient was seen by Mardy Maxin, FNP-C in collaboration with Dr. Sigrid Bathe as a part of collaborative care agreement.   Chantea Surace R. Maxin, MSN, FNP-C Internal medicine

## 2024-01-10 LAB — CHLAMYDIA/GONOCOCCUS/TRICHOMONAS, NAA
Chlamydia by NAA: NEGATIVE
Gonococcus by NAA: NEGATIVE
Trich vag by NAA: NEGATIVE

## 2024-01-11 ENCOUNTER — Telehealth: Payer: Self-pay

## 2024-01-11 DIAGNOSIS — N390 Urinary tract infection, site not specified: Secondary | ICD-10-CM

## 2024-01-11 DIAGNOSIS — R300 Dysuria: Secondary | ICD-10-CM

## 2024-01-11 LAB — CULTURE, URINE COMPREHENSIVE

## 2024-01-13 ENCOUNTER — Ambulatory Visit

## 2024-01-13 ENCOUNTER — Ambulatory Visit: Admitting: Podiatry

## 2024-01-13 ENCOUNTER — Encounter: Payer: Self-pay | Admitting: Podiatry

## 2024-01-13 ENCOUNTER — Ambulatory Visit (INDEPENDENT_AMBULATORY_CARE_PROVIDER_SITE_OTHER)

## 2024-01-13 VITALS — Ht 71.0 in | Wt 257.0 lb

## 2024-01-13 DIAGNOSIS — M65972 Unspecified synovitis and tenosynovitis, left ankle and foot: Secondary | ICD-10-CM

## 2024-01-13 DIAGNOSIS — M79672 Pain in left foot: Secondary | ICD-10-CM | POA: Diagnosis not present

## 2024-01-13 MED ORDER — METHYLPREDNISOLONE 4 MG PO TBPK
ORAL_TABLET | ORAL | 0 refills | Status: DC
Start: 2024-01-13 — End: 2024-04-20

## 2024-01-13 MED ORDER — BETAMETHASONE SOD PHOS & ACET 6 (3-3) MG/ML IJ SUSP
3.0000 mg | Freq: Once | INTRAMUSCULAR | Status: AC
  Administered 2024-01-13: 3 mg via INTRA_ARTICULAR

## 2024-01-13 MED ORDER — CIPROFLOXACIN HCL 500 MG PO TABS: 500.0000 mg | ORAL_TABLET | Freq: Two times a day (BID) | ORAL | 0 refills | Status: AC
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
## 2024-01-13 NOTE — Telephone Encounter (Signed)
Pt notified that we sent med

## 2024-01-13 NOTE — Telephone Encounter (Signed)
 Lmom that we sent med for UTI

## 2024-01-24 ENCOUNTER — Telehealth: Payer: Self-pay

## 2024-01-24 NOTE — Telephone Encounter (Signed)
 Ankle MRI PA is requiring 6 weeks worth of physical therapy within the past 6 months.

## 2024-01-25 ENCOUNTER — Encounter: Payer: Self-pay | Admitting: Podiatry

## 2024-01-27 ENCOUNTER — Ambulatory Visit
Admission: RE | Admit: 2024-01-27 | Discharge: 2024-01-27 | Disposition: A | Discharge status: Home / Self Care | Source: Ambulatory Visit | Attending: Podiatry | Admitting: Podiatry

## 2024-01-27 DIAGNOSIS — M65972 Unspecified synovitis and tenosynovitis, left ankle and foot: Secondary | ICD-10-CM

## 2024-01-30 ENCOUNTER — Other Ambulatory Visit: Payer: Self-pay

## 2024-01-30 ENCOUNTER — Telehealth: Payer: Self-pay

## 2024-01-30 MED ORDER — FLUCONAZOLE 150 MG PO TABS: ORAL_TABLET | ORAL | 0 refills | Status: AC

## 2024-01-30 NOTE — Telephone Encounter (Signed)
 Pt called that she was on antibiotic and prednisone  and she is have yeast infection  need Diflucan  as per alyssa medication sent

## 2024-02-03 ENCOUNTER — Other Ambulatory Visit: Payer: Self-pay | Admitting: Podiatry

## 2024-02-03 DIAGNOSIS — M65972 Unspecified synovitis and tenosynovitis, left ankle and foot: Secondary | ICD-10-CM

## 2024-02-10 ENCOUNTER — Encounter: Payer: Self-pay | Admitting: Podiatry

## 2024-02-10 ENCOUNTER — Ambulatory Visit (INDEPENDENT_AMBULATORY_CARE_PROVIDER_SITE_OTHER): Admitting: Podiatry

## 2024-02-10 VITALS — Ht 71.0 in | Wt 257.0 lb

## 2024-02-10 DIAGNOSIS — M7672 Peroneal tendinitis, left leg: Secondary | ICD-10-CM | POA: Diagnosis not present

## 2024-02-10 DIAGNOSIS — M216X2 Other acquired deformities of left foot: Secondary | ICD-10-CM

## 2024-02-10 DIAGNOSIS — M216X1 Other acquired deformities of right foot: Secondary | ICD-10-CM

## 2024-02-10 MED ORDER — BETAMETHASONE SOD PHOS & ACET 6 (3-3) MG/ML IJ SUSP
3.0000 mg | Freq: Once | INTRAMUSCULAR | Status: AC
  Administered 2024-02-10: 3 mg via INTRA_ARTICULAR
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
## 2024-03-05 ENCOUNTER — Telehealth: Payer: Self-pay | Admitting: Podiatry

## 2024-03-05 NOTE — Telephone Encounter (Signed)
 Checking to see if orthotics are in. Please call Pt with update. Thank you

## 2024-03-13 ENCOUNTER — Telehealth: Payer: Self-pay

## 2024-03-13 NOTE — Telephone Encounter (Signed)
 Orthotics are here  Charges pending No financial form signed and on file (as of 03/13/24) Appt needed

## 2024-03-13 NOTE — Telephone Encounter (Signed)
 LVM to schedule orthotic fitting/ pu  Orthotics in [place] bin

## 2024-03-30 ENCOUNTER — Encounter

## 2024-04-06 ENCOUNTER — Ambulatory Visit: Admitting: Podiatry

## 2024-04-09 ENCOUNTER — Encounter: Payer: Medicaid Other | Admitting: Nurse Practitioner

## 2024-04-13 ENCOUNTER — Telehealth: Payer: Self-pay | Admitting: Nurse Practitioner

## 2024-04-13 NOTE — Telephone Encounter (Signed)
 Left vm to confirm 04/20/24 appointment-Toni

## 2024-04-20 ENCOUNTER — Encounter

## 2024-04-20 ENCOUNTER — Encounter: Payer: Self-pay | Admitting: Nurse Practitioner

## 2024-04-20 ENCOUNTER — Ambulatory Visit: Admitting: Nurse Practitioner

## 2024-04-20 VITALS — BP 116/78 | HR 60 | Temp 97.1°F | Resp 16 | Ht 71.0 in | Wt 257.4 lb

## 2024-04-20 DIAGNOSIS — E1169 Type 2 diabetes mellitus with other specified complication: Secondary | ICD-10-CM | POA: Diagnosis not present

## 2024-04-20 DIAGNOSIS — E559 Vitamin D deficiency, unspecified: Secondary | ICD-10-CM

## 2024-04-20 DIAGNOSIS — Z0001 Encounter for general adult medical examination with abnormal findings: Secondary | ICD-10-CM | POA: Diagnosis not present

## 2024-04-20 DIAGNOSIS — E538 Deficiency of other specified B group vitamins: Secondary | ICD-10-CM | POA: Diagnosis not present

## 2024-04-20 DIAGNOSIS — M150 Primary generalized (osteo)arthritis: Secondary | ICD-10-CM | POA: Diagnosis not present

## 2024-04-20 DIAGNOSIS — Z1212 Encounter for screening for malignant neoplasm of rectum: Secondary | ICD-10-CM

## 2024-04-20 DIAGNOSIS — S86312A Strain of muscle(s) and tendon(s) of peroneal muscle group at lower leg level, left leg, initial encounter: Secondary | ICD-10-CM

## 2024-04-20 DIAGNOSIS — Z1211 Encounter for screening for malignant neoplasm of colon: Secondary | ICD-10-CM | POA: Diagnosis not present

## 2024-04-20 DIAGNOSIS — Z79899 Other long term (current) drug therapy: Secondary | ICD-10-CM | POA: Diagnosis not present

## 2024-04-20 DIAGNOSIS — E785 Hyperlipidemia, unspecified: Secondary | ICD-10-CM

## 2024-04-20 DIAGNOSIS — Z1231 Encounter for screening mammogram for malignant neoplasm of breast: Secondary | ICD-10-CM

## 2024-04-20 MED ORDER — METHOCARBAMOL 500 MG PO TABS: 500.0000 mg | ORAL_TABLET | Freq: Four times a day (QID) | ORAL | 5 refills | Status: AC | PRN

## 2024-04-20 MED ORDER — DAPAGLIFLOZIN PROPANEDIOL 10 MG PO TABS: 10.0000 mg | ORAL_TABLET | Freq: Every day | ORAL | 3 refills | Status: AC

## 2024-04-20 MED ORDER — LIDOCAINE 5 % EX PTCH
1.0000 | MEDICATED_PATCH | CUTANEOUS | 3 refills | Status: AC
Start: 2024-04-20 — End: ?

## 2024-04-20 MED ORDER — MELOXICAM 15 MG PO TABS: 15.0000 mg | ORAL_TABLET | Freq: Every day | ORAL | 1 refills | Status: AC

## 2024-04-20 MED ORDER — SEMAGLUTIDE (2 MG/DOSE) 8 MG/3ML ~~LOC~~ SOPN: 2.0000 mg | PEN_INJECTOR | SUBCUTANEOUS | 5 refills | Status: AC

## 2024-04-20 MED ORDER — DICLOFENAC SODIUM 1 % EX GEL
4.0000 g | Freq: Four times a day (QID) | CUTANEOUS | 3 refills | Status: AC
Start: 2024-04-20 — End: ?

## 2024-04-20 MED ORDER — FLUTICASONE PROPIONATE 50 MCG/ACT NA SUSP: NASAL | 5 refills | Status: AC

## 2024-04-20 MED ORDER — LORATADINE 10 MG PO TABS: 10.0000 mg | ORAL_TABLET | Freq: Every day | ORAL | 3 refills | Status: AC

## 2024-04-20 NOTE — Progress Notes (Unsigned)
 [facility] [address] [place], [place] [address]  Internal MEDICINE  Office Visit Note  Patient Name: Kara Perez  [id number]  [id number]  Date of Service: 04/20/2024  Chief Complaint  Patient presents with   Diabetes   Medicare Wellness    HPI Kara Perez presents for a medicare annual wellness visit. Well-appearing 53 y.o. female with osteoarthritis, diabetes, NAFLD, and vitamin D  deficiency.  Routine CRC screening: due for cologuard now.  Routine mammogram: due now  DEXA scan: not due yet Pap smear: discontinued, hysterectomy Eye exam due now  foot exam: done, sees podiatry  Labs: due for routine labs now  New or worsening pain: left foot tear, seeing podiatry Other concerns:  none       04/20/2024   10:57 AM  MMSE - Mini Mental State Exam  Orientation to time 5  Orientation to Place 5  Registration 3  Attention/ Calculation 5  Recall 3  Language- name 2 objects 2  Language- repeat 1  Language- follow 3 step command 3  Language- read & follow direction 1  Write a sentence 1  Copy design 1  Total score 30    Functional Status Survey: Is the patient deaf or have difficulty hearing?: No Does the patient have difficulty seeing, even when wearing glasses/contacts?: No Does the patient have difficulty concentrating, remembering, or making decisions?: No Does the patient have difficulty walking or climbing stairs?: Yes Does the patient have difficulty dressing or bathing?: No Does the patient have difficulty doing errands alone such as visiting a doctor's office or shopping?: Yes     02/10/2022    3:41 PM 03/24/2022    2:53 PM 07/09/2022   10:57 AM 04/04/2023    8:19 AM 04/20/2024   10:56 AM  Fall Risk  Falls in the past year? 0 0 0 0 0  Was there an injury with Fall?  0 0 0 0  Fall Risk Category Calculator  0 0 0 0  Fall Risk Category (Retired)  Low      (RETIRED) Patient Fall Risk Level  Low fall risk      Patient at Risk for Falls Due  to  No Fall Risks No Fall Risks No Fall Risks   Fall risk Follow up  Falls evaluation completed  Falls evaluation completed Falls evaluation completed Falls evaluation completed     Data saved with a previous flowsheet row definition       04/04/2023    8:19 AM  Depression screen PHQ 2/9  Decreased Interest 0  Down, Depressed, Hopeless 0  PHQ - 2 Score 0        Current Medication: Outpatient Encounter Medications as of 04/20/2024  Medication Sig   methocarbamol  (ROBAXIN ) 500 MG tablet Take 1-2 tablets (500-1,000 mg total) by mouth every 6 (six) hours as needed for muscle spasms.   Accu-Chek Softclix Lancets lancets Use a directed twice a day E11.65   B Complex Vitamins (VITAMIN B COMPLEX PO) Take by mouth. Liquid OTC   Calcium Carb-Cholecalciferol 600-10 MG-MCG TABS Take 1 tablet by mouth daily.   dapagliflozin  propanediol (FARXIGA ) 10 MG TABS tablet Take 1 tablet (10 mg total) by mouth daily.   diclofenac  Sodium (VOLTAREN ) 1 % GEL Apply 4 g topically 4 (four) times daily.   fluticasone  (FLONASE ) 50 MCG/ACT nasal spray USE 1 SPRAY(S) IN EACH NOSTRIL ONCE DAILY   lidocaine  (LIDODERM ) 5 % Place 1 patch onto the skin daily.   loratadine  (CLARITIN ) 10 MG tablet  Take 1 tablet (10 mg total) by mouth daily.   meloxicam  (MOBIC ) 15 MG tablet Take 1 tablet (15 mg total) by mouth daily.   Semaglutide , 2 MG/DOSE, 8 MG/3ML SOPN Inject 2 mg as directed once a week.   Vitamin D , Ergocalciferol , (DRISDOL ) 1.25 MG (50000 UNIT) CAPS capsule Take 1 capsule (50,000 Units total) by mouth every 7 (seven) days.   [DISCONTINUED] dapagliflozin  propanediol (FARXIGA ) 10 MG TABS tablet Take 1 tablet (10 mg total) by mouth daily.   [DISCONTINUED] diclofenac  Sodium (VOLTAREN ) 1 % GEL Apply 4 g topically 4 (four) times daily.   [DISCONTINUED] fluticasone  (FLONASE ) 50 MCG/ACT nasal spray USE 1 SPRAY(S) IN EACH NOSTRIL ONCE DAILY   [DISCONTINUED] lidocaine  (LIDODERM ) 5 % Place 1 patch onto the skin daily.    [DISCONTINUED] loratadine  (CLARITIN ) 10 MG tablet Take 1 tablet (10 mg total) by mouth daily.   [DISCONTINUED] meloxicam  (MOBIC ) 15 MG tablet Take 1 tablet (15 mg total) by mouth daily.   [DISCONTINUED] Semaglutide , 2 MG/DOSE, 8 MG/3ML SOPN Inject 2 mg as directed once a week.   No facility-administered encounter medications on file as of 04/20/2024.    Surgical History: Past Surgical History:  Procedure Laterality Date   ABDOMINAL HYSTERECTOMY     CESAREAN SECTION     lacerated liver      Medical History: Past Medical History:  Diagnosis Date   Diabetes mellitus without complication (HCC)    Prediabetes     Family History: Family History  Problem Relation Age of Onset   Diabetes Mother    Hypertension Father    Liver disease Father    Diabetes Sister    Breast cancer Paternal Grandmother 38    Social History   Socioeconomic History   Marital status: Single    Spouse name: Not on file   Number of children: Not on file   Years of education: Not on file   Highest education level: Not on file  Occupational History   Not on file  Tobacco Use   Smoking status: Never   Smokeless tobacco: Never  Vaping Use   Vaping status: Never Used  Substance and Sexual Activity   Alcohol use: Yes    Comment: ocassional   Drug use: Never   Sexual activity: Not on file  Other Topics Concern   Not on file  Social History Narrative   Not on file   Social Drivers of Health   Financial Resource Strain: Not on file  Food Insecurity: Not on file  Transportation Needs: Not on file  Physical Activity: Not on file  Stress: Not on file  Social Connections: Unknown (10/19/2021)   [facility]   Social Network    Social Network: Not on file  Intimate Partner Violence: Unknown (09/10/2021)   Received from [facility]   HITS    Physically Hurt: Not on file    Insult or Talk Down To: Not on file    Threaten Physical Harm: Not on file    Scream or Curse: Not on file       Review of Systems  Constitutional:  Negative for activity change, appetite change, chills, fatigue, fever and unexpected weight change.  HENT: Negative.  Negative for congestion, ear pain, rhinorrhea, sore throat and trouble swallowing.   Eyes: Negative.   Respiratory: Negative.  Negative for cough, chest tightness, shortness of breath and wheezing.   Cardiovascular: Negative.  Negative for chest pain and palpitations.  Gastrointestinal: Negative.  Negative for abdominal pain,  blood in stool, constipation, diarrhea, nausea and vomiting.  Endocrine: Negative.   Genitourinary: Negative.  Negative for difficulty urinating, dysuria, frequency, hematuria and urgency.  Musculoskeletal: Negative.  Negative for arthralgias, back pain, joint swelling, myalgias and neck pain.  Skin: Negative.  Negative for rash and wound.  Allergic/Immunologic: Negative.  Negative for immunocompromised state.  Neurological: Negative.  Negative for dizziness, seizures, numbness and headaches.  Hematological: Negative.   Psychiatric/Behavioral: Negative.  Negative for behavioral problems, self-injury and suicidal ideas. The patient is not nervous/anxious.     Vital Signs: BP 116/78   Pulse 60   Temp (!) 97.1 F (36.2 C)   Resp 16   Ht 5' 11 (1.803 m)   Wt 257 lb 6.4 oz (116.8 kg)   SpO2 94%   BMI 35.90 kg/m    Physical Exam Vitals reviewed.  Constitutional:      General: She is awake. She is not in acute distress.    Appearance: Normal appearance. She is well-developed and well-groomed. She is obese. She is not ill-appearing or diaphoretic.  HENT:     Head: Normocephalic and atraumatic.     Right Ear: Tympanic membrane, ear canal and external ear normal.     Left Ear: Tympanic membrane, ear canal and external ear normal.     Nose: Nose normal. No congestion or rhinorrhea.     Mouth/Throat:     Lips: Pink.     Pharynx: Oropharynx is clear. Uvula midline. No oropharyngeal exudate or posterior  oropharyngeal erythema.  Eyes:     General: Lids are normal. Vision grossly intact. Gaze aligned appropriately. No scleral icterus.       Right eye: No discharge.        Left eye: No discharge.     Extraocular Movements: Extraocular movements intact.     Conjunctiva/sclera: Conjunctivae normal.     Pupils: Pupils are equal, round, and reactive to light.  Neck:     Thyroid: No thyromegaly.     Vascular: No JVD.     Trachea: Trachea and phonation normal. No tracheal deviation.  Cardiovascular:     Rate and Rhythm: Normal rate and regular rhythm.     Pulses:          Dorsalis pedis pulses are 2+ on the right side and 2+ on the left side.       Posterior tibial pulses are 1+ on the right side and 1+ on the left side.     Heart sounds: Normal heart sounds, S1 normal and S2 normal. No murmur heard.    No friction rub. No gallop.  Pulmonary:     Effort: Pulmonary effort is normal. No accessory muscle usage or respiratory distress.     Breath sounds: Normal breath sounds and air entry. No stridor. No wheezing or rales.  Chest:     Chest wall: No tenderness.  Breasts:    Breasts are symmetrical.     Right: Normal.     Left: Normal.  Abdominal:     General: Bowel sounds are normal. There is no distension.     Palpations: Abdomen is soft. There is no mass.     Tenderness: There is no abdominal tenderness. There is no guarding or rebound.  Musculoskeletal:        General: No tenderness or deformity. Normal range of motion.     Cervical back: Normal range of motion and neck supple.     Right lower leg: No edema.     Left  lower leg: No edema.     Right foot: Normal range of motion. No deformity, bunion, Charcot foot, foot drop or prominent metatarsal heads.     Left foot: Normal range of motion. No deformity, bunion, Charcot foot, foot drop or prominent metatarsal heads.  Feet:     Right foot:     Protective Sensation: 6 sites tested.  6 sites sensed.     Skin integrity: Callus and dry  skin present. No ulcer, blister, skin breakdown, erythema, warmth or fissure.     Toenail Condition: Right toenails are abnormally thick.     Left foot:     Protective Sensation: 6 sites tested.  6 sites sensed.     Skin integrity: Callus and dry skin present. No ulcer, blister, skin breakdown, erythema, warmth or fissure.     Toenail Condition: Left toenails are abnormally thick.  Lymphadenopathy:     Cervical: No cervical adenopathy.  Skin:    General: Skin is warm and dry.     Capillary Refill: Capillary refill takes less than 2 seconds.     Coloration: Skin is not pale.     Findings: No erythema or rash.  Neurological:     Mental Status: She is alert and oriented to person, place, and time.     Cranial Nerves: No cranial nerve deficit.     Motor: No abnormal muscle tone.     Coordination: Coordination normal.     Deep Tendon Reflexes: Reflexes are normal and symmetric.  Psychiatric:        Mood and Affect: Mood normal.        Behavior: Behavior normal. Behavior is cooperative.        Thought Content: Thought content normal.        Judgment: Judgment normal.        Assessment/Plan: 1. Encounter for Medicare annual examination with abnormal findings (Primary) Age-appropriate preventive screenings and vaccinations discussed, annual physical exam completed. Routine labs for health maintenance ordered, see below. PHM updated.   - CBC with Differential/Platelet - CMP14+EGFR - Lipid Profile - Vitamin D  (25 hydroxy) - B12 and Folate Panel - Hgb A1C w/o eAG  2. Type 2 diabetes mellitus with other specified complication, without long-term current use of insulin (HCC) Urine sent to lab for ACR. Routine labs ordered. Continue ozempic  and farxiga  as prescribed.  - Urine Microalbumin w/creat. ratio - CBC with Differential/Platelet - CMP14+EGFR - Lipid Profile - Vitamin D  (25 hydroxy) - B12 and Folate Panel - Hgb A1C w/o eAG - Semaglutide , 2 MG/DOSE, 8 MG/3ML SOPN; Inject 2 mg  as directed once a week.  Dispense: 3 mL; Refill: 5 - dapagliflozin  propanediol (FARXIGA ) 10 MG TABS tablet; Take 1 tablet (10 mg total) by mouth daily.  Dispense: 90 tablet; Refill: 3  3. Hyperlipidemia associated with type 2 diabetes mellitus (HCC) Routine labs ordered  - CBC with Differential/Platelet - CMP14+EGFR - Lipid Profile - Vitamin D  (25 hydroxy) - B12 and Folate Panel - Hgb A1C w/o eAG  4. Tear of peroneal tendon of left foot Muscle relaxer prescribed, she is already seeing a specialist for this problem.  - methocarbamol  (ROBAXIN ) 500 MG tablet; Take 1-2 tablets (500-1,000 mg total) by mouth every 6 (six) hours as needed for muscle spasms.  Dispense: 120 tablet; Refill: 5  5. Primary generalized (osteo)arthritis Continue medications as prescribed.  - meloxicam  (MOBIC ) 15 MG tablet; Take 1 tablet (15 mg total) by mouth daily.  Dispense: 90 tablet; Refill: 1 - lidocaine  (LIDODERM )  5 %; Place 1 patch onto the skin daily.  Dispense: 30 patch; Refill: 3 - diclofenac  Sodium (VOLTAREN ) 1 % GEL; Apply 4 g topically 4 (four) times daily.  Dispense: 100 g; Refill: 3  6. B12 deficiency Routine labs ordered  - CBC with Differential/Platelet - B12 and Folate Panel  7. Vitamin D  deficiency Routine labs ordered  - Vitamin D  (25 hydroxy)  8. Encounter for screening mammogram for malignant neoplasm of breast Rotutine mammogram ordered  - MM 3D SCREENING MAMMOGRAM BILATERAL BREAST; Future  9. Screening for colorectal cancer Cologuard test ordered  - Cologuard  10. Encounter for medication review Medication list reviewed, updated and refills ordered  - loratadine  (CLARITIN ) 10 MG tablet; Take 1 tablet (10 mg total) by mouth daily.  Dispense: 30 tablet; Refill: 3 - fluticasone  (FLONASE ) 50 MCG/ACT nasal spray; USE 1 SPRAY(S) IN EACH NOSTRIL ONCE DAILY  Dispense: 16 g; Refill: 5    General Counseling: Kara Perez verbalizes understanding of the findings of todays visit and agrees  with plan of treatment. I have discussed any further diagnostic evaluation that may be needed or ordered today. We also reviewed her medications today. she has been encouraged to call the office with any questions or concerns that should arise related to todays visit.    Orders Placed This Encounter  Procedures   MM 3D SCREENING MAMMOGRAM BILATERAL BREAST   Urine Microalbumin w/creat. ratio   Cologuard   CBC with Differential/Platelet   CMP14+EGFR   Lipid Profile   Vitamin D  (25 hydroxy)   B12 and Folate Panel   Hgb A1C w/o eAG    Meds ordered this encounter  Medications   meloxicam  (MOBIC ) 15 MG tablet    Sig: Take 1 tablet (15 mg total) by mouth daily.    Dispense:  90 tablet    Refill:  1   loratadine  (CLARITIN ) 10 MG tablet    Sig: Take 1 tablet (10 mg total) by mouth daily.    Dispense:  30 tablet    Refill:  3   Semaglutide , 2 MG/DOSE, 8 MG/3ML SOPN    Sig: Inject 2 mg as directed once a week.    Dispense:  3 mL    Refill:  5    For future refills  Dx code E11.65   dapagliflozin  propanediol (FARXIGA ) 10 MG TABS tablet    Sig: Take 1 tablet (10 mg total) by mouth daily.    Dispense:  90 tablet    Refill:  3    Please fill as a 90 day supply.   lidocaine  (LIDODERM ) 5 %    Sig: Place 1 patch onto the skin daily.    Dispense:  30 patch    Refill:  3   fluticasone  (FLONASE ) 50 MCG/ACT nasal spray    Sig: USE 1 SPRAY(S) IN EACH NOSTRIL ONCE DAILY    Dispense:  16 g    Refill:  5   diclofenac  Sodium (VOLTAREN ) 1 % GEL    Sig: Apply 4 g topically 4 (four) times daily.    Dispense:  100 g    Refill:  3   methocarbamol  (ROBAXIN ) 500 MG tablet    Sig: Take 1-2 tablets (500-1,000 mg total) by mouth every 6 (six) hours as needed for muscle spasms.    Dispense:  120 tablet    Refill:  5    Fill new script today    Return in about 4 weeks (around 05/18/2024) for F/U, Labs, Kara Perez PCP.   Total  time spent:30 Minutes Time spent includes review of chart, medications,  test results, and follow up plan with the patient.   [place] Controlled Substance Database was reviewed by me.  This patient was seen by Kara Maxin, FNP-C in collaboration with Dr. Sigrid Bathe as a part of collaborative care agreement.  Kara Flynt R. Maxin, MSN, FNP-C Internal medicine

## 2024-04-21 ENCOUNTER — Ambulatory Visit: Payer: Self-pay | Admitting: Nurse Practitioner

## 2024-04-21 LAB — MICROALBUMIN / CREATININE URINE RATIO
Creatinine, Urine: 151.3 mg/dL
Microalb/Creat Ratio: 62 mg/g{creat} — ABNORMAL HIGH (ref 0–29)
Microalbumin, Urine: 93.4 ug/mL

## 2024-04-21 NOTE — Progress Notes (Signed)
 Microalbuminuria is improving compared to last year. Continue current medications as prescribed.

## 2024-04-27 ENCOUNTER — Ambulatory Visit: Payer: Self-pay | Admitting: Family Medicine

## 2024-04-27 DIAGNOSIS — Z113 Encounter for screening for infections with a predominantly sexual mode of transmission: Secondary | ICD-10-CM

## 2024-04-27 LAB — WET PREP FOR TRICH, YEAST, CLUE
Clue Cell Exam: NEGATIVE
Trichomonas Exam: NEGATIVE
Yeast Exam: NEGATIVE

## 2024-04-27 LAB — HM HIV SCREENING LAB: HM HIV Screening: NEGATIVE

## 2024-04-27 NOTE — Progress Notes (Signed)
 [facility] Department STI clinic [address], [address] [place] [address] Main phone: [phone]  STI screening visit  Subjective:  Kara Perez is a 53 y.o. female being seen today for an STI screening visit. The patient reports they do not have symptoms.    Patient reports they are not pregnant  and not breastfeeding. They do not desire a pregnancy in the next year. Patient is currently using no method - hysterectomy to prevent pregnancy. They reported they are not interested in discussing contraception today.    No LMP recorded. Patient has had a hysterectomy.  Patient has the following medical conditions:  Patient Active Problem List   Diagnosis Date Noted   Tear of peroneal tendon of left foot 04/20/2024   Hyperlipidemia associated with type 2 diabetes mellitus (HCC) 05/10/2023   Vulvovaginal candidiasis 05/09/2023   Diabetes mellitus (HCC) 05/08/2022   NAFLD (nonalcoholic fatty liver disease) 87/97/7976   Prediabetes 10/03/2019   Visual changes 10/03/2019   Diminished night vision 10/03/2019   Screen for STD (sexually transmitted disease) 10/03/2019   Hemangioma 07/02/2019   Other fatigue 05/20/2019   Left upper quadrant abdominal pain 03/25/2019   Adult body mass index 37.0-37.9 03/25/2019   Encounter for screening mammogram for malignant neoplasm of breast 03/25/2019   Screening for colon cancer 03/25/2019   Body mass index (BMI) of 36.0 to 36.9 in adult 03/14/2018   White blood cell abnormality 03/14/2018   Dysuria 03/14/2018   Chronic pain of left knee 02/08/2018   Chronic right hip pain 02/08/2018   Primary generalized (osteo)arthritis 02/08/2018   Vitamin D  deficiency 02/08/2018   Encounter for general adult medical examination with abnormal findings 02/08/2018   ANA positive 01/02/2015   Closed fracture pubis (HCC) 02/02/2012   Hemorrhage of gastrointestinal tract 11/24/2011   Chief Complaint  Patient presents with    SEXUALLY TRANSMITTED DISEASE   HPI Patient reports desire for asymptomatic STI screening before starting a new relationship. Does not smoke.   Does the patient using douching products? Yes  See flowsheet for further details and programmatic requirements Hyperlink available at the top of the signed note in blue.  Flow sheet content below:  Pregnancy Intention Screening Does the patient want to become pregnant in the next year?: No Does the patient's partner want to become pregnant in the next year?: No Would the patient like to discuss contraceptive options today?: No All Patients Anyone smoke around pt and/or pt's children?: No Anyone smoke inside pt's house?: No Anyone smoke inside car?: No Anyone smoke inside the workplace?: No Reason For STD Screen STD Screening: Is asymptomatic Have you ever had an STD?: No History of Antibiotic use in the past 2 weeks?: No STD Symptoms Denies all: Yes Risk Factors for Hep B Household, sexual, or needle sharing contact of a person infected with Hep B: No Sexual contact with a person who uses drugs not as prescribed?: No Currently or Ever used drugs not as prescribed: No HIV Positive: No PRep Patient: No Men who have sex with men: N/A Have Hepatitis C: No History of Incarceration: No History of Homeslessness?: No Anal sex following anal drug use?: N/A Risk Factors for Hep C Currently using drugs not as prescribed: No Sexual partner(s) currently using drugs as not prescribed: No History of drug use: No HIV Positive: No People with a history of incarceration: No People born between the years of [birth year] and [birth year]: No Abuse History Does patient feel they have a problem  with Anxiety?: No Does patient feel they have a problem with Depression?: No Referral to Behavioral Health: No Counseling Patient counseled to use condoms with all sex: Condoms given RTC in 2-3 weeks for test results: Yes Clinic will call if test results abnormal before  test result appt.: Yes Test results given to patient Patient counseled to use condoms with all sex: Condoms given   Screening for MPX risk:  Unexplained rash?  No   MSM?  No   Multiple or anonymous sex partners?  No   Any close or sexual contact with a person  diagnosed with MPX?  No   Any outside the [place]  where MPX is endemic?  No   High clinical suspicion for MPX?    -Unlikely to be chickenpox    -Lymphadenopathy    -Rash that presents in same phase of       evolution on any given body part  No   Screenings: Last HIV test per patient/review of record was No results found for: HMHIVSCREEN  Lab Results  Component Value Date   HIV Non Reactive 07/09/2022     Last HEPC test per patient/review of record was No results found for: HMHEPCSCREEN No components found for: HEPC   Last HEPB test per patient/review of record was No components found for: HMHEPBSCREEN   Patient reports last pap was:   No results found for: SPECADGYN No Cervical Cancer Screening results to display.  Immunization history:   There is no immunization history on file for this patient.  The following portions of the patient's history were reviewed and updated as appropriate: allergies, current medications, past medical history, past social history, past surgical history and problem list.  Objective:  There were no vitals filed for this visit.  Physical Exam Vitals and nursing note reviewed.  Constitutional:      Appearance: Normal appearance.  HENT:     Head: Normocephalic.     Mouth/Throat:     Mouth: Mucous membranes are moist.  Cardiovascular:     Rate and Rhythm: Normal rate.  Pulmonary:     Effort: Pulmonary effort is normal.  Abdominal:     Palpations: Abdomen is soft.  Genitourinary:    Comments: Declined genital exam- no symptoms, self swabbed Musculoskeletal:        General: Normal range of motion.  Lymphadenopathy:     Head:     Right side of head: No submandibular,  preauricular or posterior auricular adenopathy.     Left side of head: No submandibular, preauricular or posterior auricular adenopathy.     Cervical: No cervical adenopathy.     Upper Body:     Right upper body: No supraclavicular adenopathy.     Left upper body: No supraclavicular adenopathy.  Skin:    General: Skin is warm and dry.  Neurological:     Mental Status: She is alert and oriented to person, place, and time.  Psychiatric:        Mood and Affect: Mood normal.     Assessment and Plan:  Kara Perez is a 53 y.o. female presenting to the [facility] Department for STI screening  Screening examination for venereal disease -     HIV [place] [facility] -     Syphilis Serology, [place] [facility] -     Chlamydia/Gonorrhea [place] [facility] -     WET PREP FOR TRICH, YEAST, CLUE  Patient accepted the following screenings: vaginal CT/GC swab, vaginal wet prep, HIV, and  RPR Patient meets criteria for HepB screening? No. Ordered? not applicable Patient meets criteria for HepC screening? No. Ordered? not applicable  Treat wet prep per standing order Discussed time line for [facility] results and that patient will be called with positive results and encouraged patient to call if she had not heard in 2 weeks.  Counseled to return or seek care for continued or worsening symptoms Recommended repeat testing in 3 months with positive results. Recommended condom use with all sex for STI prevention.   No follow-ups on file.  Future Appointments  Date Time Provider Department Center  05/25/2024 10:00 AM Liana Fish, NP [facility] None  04/23/2025  9:20 AM Liana Fish, NP [facility] None    Betsey CHRISTELLA Helling, MD

## 2024-04-27 NOTE — Patient Instructions (Signed)
 STI screening - Today we obtained a vaginal swab to screen for gonorrhea, chlamydia, and trichomonas - We also obtained a blood sample to screen for HIV and syphilis - If the results are abnormal, I will give you a call.    Estimated time frame for results collected at the [facility] Department: Same day Trichomonas Yeast BV (bacterial vaginosis)  Within 3-5 days Gonorrhea Chlamydia  Within 1 week HIV Syphilis Hepatitis B Hepatitis C

## 2024-04-27 NOTE — Progress Notes (Signed)
 Pt is here for STD screening. Wet prep results reviewed with patient and requires no treatment per SO. Opportunity given to patient to ask questions for any clarifications. Qestions answered.  Condoms given. Kwadwo Felcia Huebert,RN.

## 2024-05-07 ENCOUNTER — Telehealth: Payer: Self-pay

## 2024-05-07 NOTE — Telephone Encounter (Signed)
Completed P.A. for patient's Ozempic. 

## 2024-05-09 ENCOUNTER — Telehealth: Payer: Self-pay

## 2024-05-09 ENCOUNTER — Other Ambulatory Visit: Payer: Self-pay | Admitting: Nurse Practitioner

## 2024-05-09 NOTE — Telephone Encounter (Signed)
 Called patient to let her know that her Ozempic  is approved for 1 year.

## 2024-05-18 ENCOUNTER — Other Ambulatory Visit
Admission: RE | Admit: 2024-05-18 | Discharge: 2024-05-18 | Disposition: A | Source: Ambulatory Visit | Attending: Nurse Practitioner | Admitting: Nurse Practitioner

## 2024-05-18 LAB — CBC WITH DIFFERENTIAL/PLATELET
Abs Immature Granulocytes: 0.01 K/uL (ref 0.00–0.07)
Basophils Absolute: 0 K/uL (ref 0.0–0.1)
Basophils Relative: 1 %
Eosinophils Absolute: 0.1 K/uL (ref 0.0–0.5)
Eosinophils Relative: 3 %
HCT: 43.7 % (ref 36.0–46.0)
Hemoglobin: 13.8 g/dL (ref 12.0–15.0)
Immature Granulocytes: 0 %
Lymphocytes Relative: 23 %
Lymphs Abs: 0.7 K/uL (ref 0.7–4.0)
MCH: 29.3 pg (ref 26.0–34.0)
MCHC: 31.6 g/dL (ref 30.0–36.0)
MCV: 92.8 fL (ref 80.0–100.0)
Monocytes Absolute: 0.3 K/uL (ref 0.1–1.0)
Monocytes Relative: 8 %
Neutro Abs: 2.1 K/uL (ref 1.7–7.7)
Neutrophils Relative %: 65 %
Platelets: 197 K/uL (ref 150–400)
RBC: 4.71 MIL/uL (ref 3.87–5.11)
RDW: 14.3 % (ref 11.5–15.5)
WBC: 3.3 K/uL — ABNORMAL LOW (ref 4.0–10.5)
nRBC: 0 % (ref 0.0–0.2)

## 2024-05-18 LAB — LIPID PANEL
Cholesterol: 216 mg/dL — ABNORMAL HIGH (ref 0–200)
HDL: 58 mg/dL (ref >40)
LDL Cholesterol: 140 mg/dL — ABNORMAL HIGH (ref 0–99)
Total CHOL/HDL Ratio: 3.7 ratio
Triglycerides: 88 mg/dL (ref <150)
VLDL: 18 mg/dL (ref 0–40)

## 2024-05-18 LAB — COMPREHENSIVE METABOLIC PANEL WITH GFR
ALT: 24 U/L (ref 0–44)
AST: 31 U/L (ref 15–41)
Albumin: 4.3 g/dL (ref 3.5–5.0)
Alkaline Phosphatase: 120 U/L (ref 38–126)
Anion gap: 10 (ref 5–15)
BUN: 17 mg/dL (ref 6–20)
CO2: 25 mmol/L (ref 22–32)
Calcium: 9.5 mg/dL (ref 8.9–10.3)
Chloride: 104 mmol/L (ref 98–111)
Creatinine, Ser: 0.67 mg/dL (ref 0.44–1.00)
GFR, Estimated: >60 mL/min (ref >60)
Glucose, Bld: 99 mg/dL (ref 70–99)
Potassium: 4.1 mmol/L (ref 3.5–5.1)
Sodium: 139 mmol/L (ref 135–145)
Total Bilirubin: 0.5 mg/dL (ref 0.0–1.2)
Total Protein: 7.3 g/dL (ref 6.5–8.1)

## 2024-05-18 LAB — FOLATE: Folate: 17.8 ng/mL (ref >5.9)

## 2024-05-18 LAB — VITAMIN D 25 HYDROXY (VIT D DEFICIENCY, FRACTURES): Vit D, 25-Hydroxy: 43.25 ng/mL (ref 30–100)

## 2024-05-18 LAB — VITAMIN B12: Vitamin B-12: 364 pg/mL (ref 180–914)

## 2024-05-18 LAB — HEMOGLOBIN A1C
Hgb A1c MFr Bld: 5.8 % — ABNORMAL HIGH (ref 4.8–5.6)
Mean Plasma Glucose: 119.76 mg/dL

## 2024-05-25 ENCOUNTER — Encounter: Payer: Self-pay | Admitting: Nurse Practitioner

## 2024-05-25 ENCOUNTER — Ambulatory Visit: Admitting: Family Medicine

## 2024-05-25 ENCOUNTER — Ambulatory Visit: Admitting: Nurse Practitioner

## 2024-05-25 VITALS — BP 132/80 | HR 81 | Temp 97.1°F | Resp 16 | Ht 71.0 in | Wt 256.0 lb

## 2024-05-25 DIAGNOSIS — E785 Hyperlipidemia, unspecified: Secondary | ICD-10-CM

## 2024-05-25 DIAGNOSIS — E1169 Type 2 diabetes mellitus with other specified complication: Secondary | ICD-10-CM

## 2024-05-25 DIAGNOSIS — E538 Deficiency of other specified B group vitamins: Secondary | ICD-10-CM | POA: Insufficient documentation

## 2024-05-25 DIAGNOSIS — E559 Vitamin D deficiency, unspecified: Secondary | ICD-10-CM

## 2024-05-25 DIAGNOSIS — Z113 Encounter for screening for infections with a predominantly sexual mode of transmission: Secondary | ICD-10-CM

## 2024-05-25 DIAGNOSIS — K760 Fatty (change of) liver, not elsewhere classified: Secondary | ICD-10-CM

## 2024-05-25 LAB — WET PREP FOR TRICH, YEAST, CLUE
Clue Cell Exam: NEGATIVE
Trichomonas Exam: NEGATIVE
Yeast Exam: NEGATIVE

## 2024-05-25 LAB — HM HIV SCREENING LAB: HM HIV Screening: NEGATIVE

## 2024-05-25 MED ORDER — CYANOCOBALAMIN 1000 MCG/ML IJ SOLN
1000.0000 ug | Freq: Once | INTRAMUSCULAR | Status: AC
  Administered 2024-05-25: 1000 ug via INTRAMUSCULAR

## 2024-05-25 MED ORDER — VITAMIN D (ERGOCALCIFEROL) 1.25 MG (50000 UNIT) PO CAPS: 50000.0000 [IU] | ORAL_CAPSULE | ORAL | 1 refills | Status: AC

## 2024-05-25 NOTE — Progress Notes (Signed)
 [facility] [address] [place], [place] [address]  Internal MEDICINE  Office Visit Note  Patient Name: Kara Perez  [id number]  [id number]  Date of Service: 05/25/2024  Chief Complaint  Patient presents with   Diabetes   Follow-up    Review labs    HPI [name] presents for a follow-up visit for diabetes, high cholesterol and lab results.  Diabetes -- A1c is stable at 5.8 Borderline low B12 -- injection today LDL is elevated at 140. Triglycerides decreased further and HDL increased by 2 Benign ethnic neutropenia.  Vitamin D  level has improved to normal range Cologuard test has been submitted and is in process   Current Medication: Outpatient Encounter Medications as of 05/25/2024  Medication Sig   Accu-Chek Softclix Lancets lancets Use a directed twice a day E11.65   B Complex Vitamins (VITAMIN B COMPLEX PO) Take by mouth. Liquid OTC   Calcium Carb-Cholecalciferol 600-10 MG-MCG TABS Take 1 tablet by mouth daily.   dapagliflozin  propanediol (FARXIGA ) 10 MG TABS tablet Take 1 tablet (10 mg total) by mouth daily.   diclofenac  Sodium (VOLTAREN ) 1 % GEL Apply 4 g topically 4 (four) times daily.   fluconazole  (DIFLUCAN ) 150 MG tablet Take 1 tab po once may repeat in 3 days if symptoms persist   fluticasone  (FLONASE ) 50 MCG/ACT nasal spray USE 1 SPRAY(S) IN EACH NOSTRIL ONCE DAILY   lidocaine  (LIDODERM ) 5 % Place 1 patch onto the skin daily.   loratadine  (CLARITIN ) 10 MG tablet Take 1 tablet (10 mg total) by mouth daily.   meloxicam  (MOBIC ) 15 MG tablet Take 1 tablet (15 mg total) by mouth daily.   methocarbamol  (ROBAXIN ) 500 MG tablet Take 1-2 tablets (500-1,000 mg total) by mouth every 6 (six) hours as needed for muscle spasms.   Semaglutide , 2 MG/DOSE, 8 MG/3ML SOPN Inject 2 mg as directed once a week.   Vitamin D , Ergocalciferol , (DRISDOL ) 1.25 MG (50000 UNIT) CAPS capsule Take 1 capsule (50,000 Units total) by mouth every 7 (seven) days.    Facility-Administered Encounter Medications as of 05/25/2024  Medication   cyanocobalamin  (VITAMIN B12) injection 1,000 mcg    Surgical History: Past Surgical History:  Procedure Laterality Date   ABDOMINAL HYSTERECTOMY     CESAREAN SECTION     lacerated liver      Medical History: Past Medical History:  Diagnosis Date   Diabetes mellitus without complication (HCC)    Prediabetes     Family History: Family History  Problem Relation Age of Onset   Diabetes Mother    Hypertension Father    Liver disease Father    Diabetes Sister    Breast cancer Paternal Grandmother 11    Social History   Socioeconomic History   Marital status: Single    Spouse name: Not on file   Number of children: Not on file   Years of education: Not on file   Highest education level: Not on file  Occupational History   Not on file  Tobacco Use   Smoking status: Never   Smokeless tobacco: Never  Vaping Use   Vaping status: Never Used  Substance and Sexual Activity   Alcohol use: Yes    Comment: ocassional   Drug use: Never   Sexual activity: Not on file  Other Topics Concern   Not on file  Social History Narrative   Not on file   Social Drivers of Health   Tobacco Use: Low Risk (05/25/2024)   Patient History  Smoking Tobacco Use: Never    Smokeless Tobacco Use: Never    Passive Exposure: Not on file  Financial Resource Strain: Not on file  Food Insecurity: Not on file  Transportation Needs: Not on file  Physical Activity: Not on file  Stress: Not on file  Social Connections: Unknown (10/19/2021)   [facility]   Social Network    Social Network: Not on file  Intimate Partner Violence: Unknown (09/10/2021)   Received from [facility]   HITS    Physically Hurt: Not on file    Insult or Talk Down To: Not on file    Threaten Physical Harm: Not on file    Scream or Curse: Not on file  Depression (PHQ2-9): Low Risk (04/04/2023)   Depression (PHQ2-9)     PHQ-2 Score: 0  Alcohol Screen: Low Risk (02/10/2022)   Alcohol Screen    Last Alcohol Screening Score (AUDIT): 3  Housing: Not on file  Utilities: Not on file  Health Literacy: Not on file      Review of Systems  Constitutional:  Positive for fatigue. Negative for appetite change, chills and unexpected weight change.  HENT:  Negative for congestion, rhinorrhea, sneezing and sore throat.   Eyes:  Negative for redness.  Respiratory: Negative.  Negative for cough, chest tightness, shortness of breath and wheezing.   Cardiovascular: Negative.  Negative for chest pain and palpitations.  Gastrointestinal:  Negative for abdominal pain, constipation, diarrhea, nausea and vomiting.  Genitourinary:  Negative for dysuria and frequency.  Musculoskeletal:  Negative for arthralgias, back pain, joint swelling and neck pain.  Skin:  Negative for rash.  Neurological: Negative.  Negative for tremors and numbness.  Hematological:  Negative for adenopathy. Does not bruise/bleed easily.  Psychiatric/Behavioral:  Positive for behavioral problems (Depression) and sleep disturbance. Negative for dysphoric mood, self-injury and suicidal ideas. The patient is nervous/anxious.     Vital Signs: BP 132/80   Pulse 81   Temp (!) 97.1 F (36.2 C)   Resp 16   Ht 5' 11 (1.803 m)   Wt 256 lb (116.1 kg)   SpO2 96%   BMI 35.70 kg/m    Physical Exam Vitals reviewed.  Constitutional:      General: She is not in acute distress.    Appearance: Normal appearance. She is obese. She is not ill-appearing.  HENT:     Head: Normocephalic and atraumatic.  Eyes:     Pupils: Pupils are equal, round, and reactive to light.  Cardiovascular:     Rate and Rhythm: Normal rate and regular rhythm.  Pulmonary:     Effort: Pulmonary effort is normal. No respiratory distress.  Neurological:     Mental Status: She is alert and oriented to person, place, and time.  Psychiatric:        Mood and Affect: Mood is depressed.  Mood is not anxious. Affect is not tearful.        Behavior: Behavior normal. Behavior is cooperative.        Assessment/Plan: 1. Type 2 diabetes mellitus with other specified complication, without long-term current use of insulin (HCC) (Primary) Stable, A1c is 5.8. continue medications as prescribed.   2. Hyperlipidemia associated with type 2 diabetes mellitus (HCC) Not currently on any statin therapy. Consider statin therapy at next office visit.  3. NAFLD (nonalcoholic fatty liver disease) Continue to monitor labs, continue ozempic  as prescribed.   4. B12 deficiency B12 injection administered in office today, may continue with 12 injections  or OTC oral B12 supplement.  - cyanocobalamin  (VITAMIN B12) injection 1,000 mcg  5. Vitamin D  deficiency Continue weekly vitamin D  supplement as prescribed.  - Vitamin D , Ergocalciferol , (DRISDOL ) 1.25 MG (50000 UNIT) CAPS capsule; Take 1 capsule (50,000 Units total) by mouth every 7 (seven) days.  Dispense: 12 capsule; Refill: 1   General Counseling: Lindsi verbalizes understanding of the findings of todays visit and agrees with plan of treatment. I have discussed any further diagnostic evaluation that may be needed or ordered today. We also reviewed her medications today. she has been encouraged to call the office with any questions or concerns that should arise related to todays visit.    No orders of the defined types were placed in this encounter.   Meds ordered this encounter  Medications   cyanocobalamin  (VITAMIN B12) injection 1,000 mcg    Return in about 5 months (around 10/23/2024) for F/U, Recheck A1C, Lorijean Husser PCP.   Total time spent:30 Minutes Time spent includes review of chart, medications, test results, and follow up plan with the patient.   [place] Controlled Substance Database was reviewed by me.  This patient was seen by Mardy Maxin, FNP-C in collaboration with Dr. Sigrid Bathe as a part of collaborative care  agreement.   Acelin Ferdig R. Maxin, MSN, FNP-C Internal medicine

## 2024-05-25 NOTE — Progress Notes (Signed)
 Pt is here for STD screening. Wet prep results reviewed with patient and requires no treatment per SO. Opportunity given to patient to ask questions for any clarifications. Questions answered.  Condoms declined. Kwadwo Tonishia Steffy,RN.

## 2024-05-25 NOTE — Patient Instructions (Signed)
 Start over the counter oral B12 supplement 1000 mcg daily.

## 2024-05-25 NOTE — Progress Notes (Signed)
 " [facility] STI clinic [address] [place] [address] Main phone: [phone]  STI screening visit  Subjective:  Kara Perez is a 53 y.o. female being seen today for an STI screening visit. The patient reports they do have symptoms.    Patient has the following medical conditions:  Patient Active Problem List   Diagnosis Date Noted   Tear of peroneal tendon of left foot 04/20/2024   Hyperlipidemia associated with type 2 diabetes mellitus (HCC) 05/10/2023   Vulvovaginal candidiasis 05/09/2023   Diabetes mellitus (HCC) 05/08/2022   NAFLD (nonalcoholic fatty liver disease) 87/97/7976   Prediabetes 10/03/2019   Visual changes 10/03/2019   Diminished night vision 10/03/2019   Screen for STD (sexually transmitted disease) 10/03/2019   Hemangioma 07/02/2019   Other fatigue 05/20/2019   Left upper quadrant abdominal pain 03/25/2019   Adult body mass index 37.0-37.9 03/25/2019   Encounter for screening mammogram for malignant neoplasm of breast 03/25/2019   Screening for colon cancer 03/25/2019   Body mass index (BMI) of 36.0 to 36.9 in adult 03/14/2018   White blood cell abnormality 03/14/2018   Dysuria 03/14/2018   Chronic pain of left knee 02/08/2018   Chronic right hip pain 02/08/2018   Primary generalized (osteo)arthritis 02/08/2018   Vitamin D  deficiency 02/08/2018   Encounter for general adult medical examination with abnormal findings 02/08/2018   ANA positive 01/02/2015   Closed fracture pubis (HCC) 02/02/2012   Hemorrhage of gastrointestinal tract 11/24/2011   Chief Complaint  Patient presents with   SEXUALLY TRANSMITTED DISEASE    HPI Patient reports 1 week of vaginal itching. Reports douching 3 x/ month, declines vaginal exam today- reports she is pressed for time.   Reproductive considerations Patient reports they are not pregnant  and not breastfeeding. They do not desire a pregnancy in the next year.  Patient is currently using unknown or not reported to prevent pregnancy. They reported they are not interested in discussing contraception today.    No LMP recorded. Patient has had a hysterectomy.  Does the patient using douching products? Yes  Patient's routine cervical screening is unknown   See flowsheet for further details and programmatic requirements Hyperlink available at the top of the signed note in blue.  Flow sheet content below:  Pregnancy Intention Screening Does the patient want to become pregnant in the next year?: No Does the patient's partner want to become pregnant in the next year?: N/A Would the patient like to discuss contraceptive options today?: No All Patients Anyone smoke around pt and/or pt's children?: No Anyone smoke inside pt's house?: No Anyone smoke inside car?: No Anyone smoke inside the workplace?: No Reason For STD Screen STD Screening: Has symptoms Have you ever had an STD?: No History of Antibiotic use in the past 2 weeks?: No STD Symptoms Denies all: No Genital Itching: Yes Genital Itch s/s: x 1 week Risk Factors for Hep B Household, sexual, or needle sharing contact of a person infected with Hep B: No Sexual contact with a person who uses drugs not as prescribed?: No Currently or Ever used drugs not as prescribed: No HIV Positive: No PRep Patient: No Men who have sex with men: N/A Have Hepatitis C: No History of Incarceration: No History of Homeslessness?: No Anal sex following anal drug use?: N/A Risk Factors for Hep C Currently using drugs not as prescribed: No Sexual partner(s) currently using drugs as not prescribed: No History of drug use: No HIV Positive:  No People with a history of incarceration: No People born between the years of 37 and 88: No Abuse History Has patient ever been abused physically?: No Has patient ever been abused sexually?: No Does patient feel they have a problem with Anxiety?: No Does patient  feel they have a problem with Depression?: No Counseling Patient counseled to use condoms with all sex: Condoms declined RTC in 2-3 weeks for test results: Yes Clinic will call if test results abnormal before test result appt.: Yes Test results given to patient Patient counseled to use condoms with all sex: Condoms declined   Screening for MPX risk:  Unexplained rash?  No   MSM?  No   Multiple or anonymous sex partners?  No   Any close or sexual contact with a person  diagnosed with MPX?  No   Any outside the [place]  where MPX is endemic?  No   High clinical suspicion for MPX?    -Unlikely to be chickenpox    -Lymphadenopathy    -Rash that presents in same phase of       evolution on any given body part  No   Does this patient meet CDC recommendations for vaccination against MPOX? No  You already have or anticipate having the following risks:  Your sex partner has the following risks: You're traveling to a county with a clade I MPOX outbreak and anticipate these risks: Occupational exposure  You had known or suspected exposure to someone with monkeypox You had a sex partner in the past 2 weeks who was diagnosed with monkeypox You are a gay, bisexual, or other man who has sex with men, or are transgender or nonbinary and in the past 6 months have had any of the following: - A new diagnosis of one or more sexually transmitted diseases (e.g., chlamydia, gonorrhea, or syphilis) - More than one sex partner You have had any of the following in the past 6 months: - Sex at a commercial sex venue (like a sex club or bathhouse) - Sex related to a large commercial event   or in a geographic area (city or county for example) where mpox virus transmission is occurring Sex with a new partner Sex at a commercial sex venue (e.g., a sex club or bathhouse) Sex in it consultant for money, goods, drugs, or other trade Sex in association with a large public event (e.g., a rave, party, or festival) i.e. certain  people who work in a laboratory or healthcare facility   Infectious disease screenings: Vaccinated against HPV? Unknown  HIV Ever had a positive? No Last test: 2025 Results in chart:  Lab Results  Component Value Date   HMHIVSCREEN Negative - Validated 04/27/2024    Lab Results  Component Value Date   HIV Non Reactive 07/09/2022     Hep B Hep B status: unknown or no prior testing Received HBV vaccination? Unknown Received HBV testing for immunity? Unknown Results in chart:  No components found for: HMHEPBSCREEN  Do they qualify for HBV screening today? No Criteria:  -Household, sexual or needle sharing contact with HBV -History of drug use or homelessness -HIV positive -Those with known Hep C  Hep C Hep C status: unknown or no prior testing Results in chart:  No results found for: HMHEPCSCREEN No components found for: HEPC  Do they qualify for HCV screening today? No Criteria - since the last HCV result, does the patient have any of the following? - Current drug use - Have a partner with  drug use - Has been incarcerated  Immunization history:   There is no immunization history on file for this patient.  The following portions of the patient's history were reviewed and updated as appropriate: allergies, current medications, past medical history, past social history, past surgical history and problem list.  Substance use screenings:  Uses tobacco products? No Uses vapes? No Uses alcohol? No Uses non-injectable substances that alter your mental status? No Uses non-prescribed injectable substances? No  Objective:  There were no vitals filed for this visit.  Physical Exam Vitals and nursing note reviewed.  Constitutional:      Appearance: Normal appearance.  HENT:     Head: Normocephalic and atraumatic.     Comments: No nits or hair loss on scalp, brows, and lashes    Mouth/Throat:     Mouth: Mucous membranes are moist.     Pharynx: Oropharynx is  clear. No oropharyngeal exudate or posterior oropharyngeal erythema.  Eyes:     General:        Right eye: No discharge.        Left eye: No discharge.     Conjunctiva/sclera: Conjunctivae normal.     Right eye: Right conjunctiva is not injected.     Left eye: Left conjunctiva is not injected.  Pulmonary:     Effort: Pulmonary effort is normal.  Abdominal:     Tenderness: There is no abdominal tenderness. There is no rebound.  Genitourinary:    Comments: Politely declined genital exam. Lymphadenopathy:     Cervical: No cervical adenopathy.     Upper Body:     Right upper body: No supraclavicular, axillary or epitrochlear adenopathy.     Left upper body: No supraclavicular, axillary or epitrochlear adenopathy.     Comments: Patient declines pelvic exam, inguinal lymph nodes not evaluated.  Skin:    General: Skin is warm and dry.     Findings: No lesion or rash.  Neurological:     Mental Status: She is alert and oriented to person, place, and time.  Psychiatric:        Mood and Affect: Mood normal.     Assessment and Plan:  SYERRA ABDELRAHMAN is a 53 y.o. female presenting to the [facility] Department for STI screening.  Patient accepted the following screenings: oral GC culture, vaginal CT/GC swab, vaginal wet prep, HIV, and RPR  1. Screening for venereal disease (Primary)  - Chlamydia/Gonorrhea [facility] - HIV [facility] - Syphilis Serology, [facility] - Gonococcus culture - WET PREP FOR TRICH, YEAST, CLUE   Counseling: Discussed time line for [facility] results and that patient will be called with positive results and encouraged patient to call if they had not heard in 2 weeks.  Counseled to return or seek care for continued or worsening symptoms Recommended repeat testing in 3 months with positive results. Recommended condom use with all sex for STI prevention.   Return if symptoms worsen or fail to improve, for STI screening.  Future Appointments   Date Time Provider Department Center  05/25/2024 10:00 AM Liana Fish, NP [facility] None  04/23/2025  9:20 AM Liana Fish, NP [facility] None    Verneta Bers, FNP "

## 2024-05-28 ENCOUNTER — Encounter: Payer: Self-pay | Admitting: Nurse Practitioner

## 2024-05-28 LAB — COLOGUARD: COLOGUARD: NEGATIVE

## 2024-05-29 LAB — GONOCOCCUS CULTURE

## 2024-06-08 ENCOUNTER — Encounter: Payer: Self-pay | Admitting: Nurse Practitioner

## 2024-10-12 ENCOUNTER — Ambulatory Visit: Admitting: Nurse Practitioner

## 2025-04-23 ENCOUNTER — Ambulatory Visit: Admitting: Nurse Practitioner
